# Patient Record
Sex: Female | Born: 1985 | Race: White | Hispanic: No | Marital: Married | Smoking: Current every day smoker
Health system: Southern US, Community
[De-identification: ages and names within clinical notes are randomized; demographics above are authoritative.]

## PROBLEM LIST (undated history)

## (undated) DIAGNOSIS — R569 Unspecified convulsions: Secondary | ICD-10-CM

## (undated) DIAGNOSIS — F32A Depression, unspecified: Secondary | ICD-10-CM

## (undated) DIAGNOSIS — F329 Major depressive disorder, single episode, unspecified: Secondary | ICD-10-CM

## (undated) DIAGNOSIS — F419 Anxiety disorder, unspecified: Secondary | ICD-10-CM

## (undated) HISTORY — PX: ABDOMINAL HYSTERECTOMY: SHX81

## (undated) HISTORY — PX: ABDOMINAL SURGERY: SHX537

---
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
## 2007-08-24 IMAGING — CT CT HEAD W/O CM
1 series · 16 of 28 positions shown, 20 images · IV contrast (agent unspecified)
Comparison: Previous study of 12/09/04.

CLINICAL DATA: Syncope.  Headache.  Patient had a fall.  
 HEAD CT WITHOUT CONTRAST:
TECHNIQUE: Contiguous axial images were obtained from the base of the skull through the vertex according to standard protocol without contrast.

[Series 625: — · axial · 0.44mm/px · z∈[-636,-511]mm · 16 of 28 slices shown, 20 images]
[im 2/28  brain]
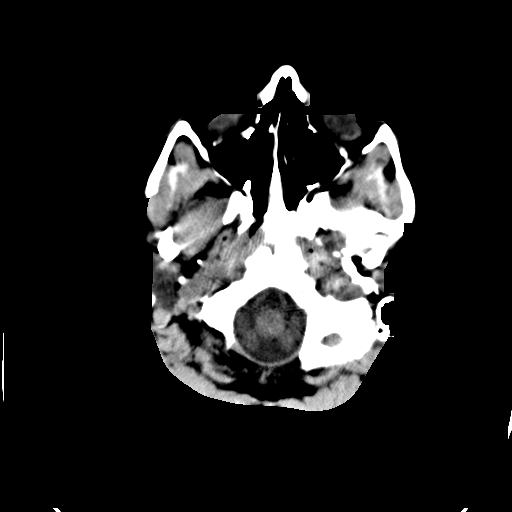
[im 2/28  bone]
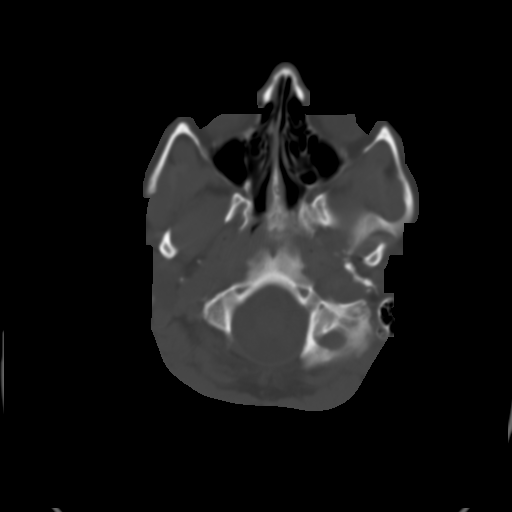
[im 4/28  brain]
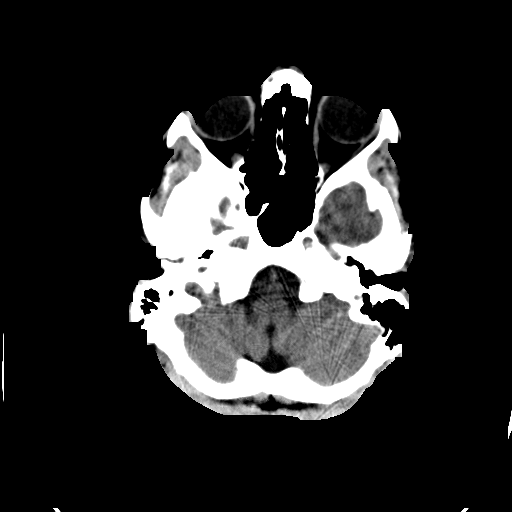
[im 6/28  brain]
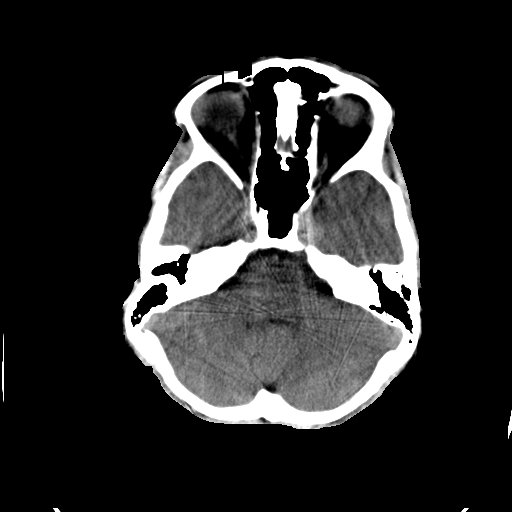
[im 7/28  brain]
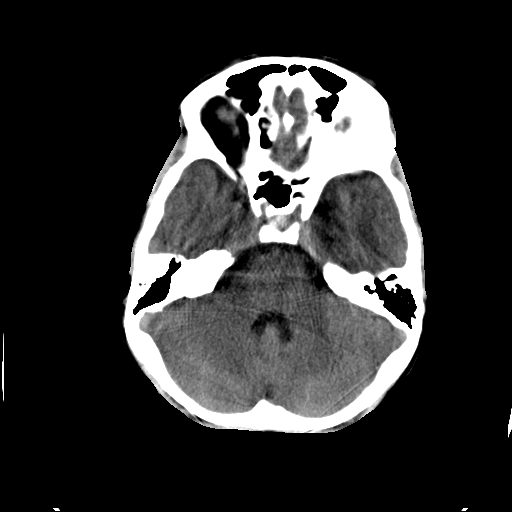
[im 9/28  brain]
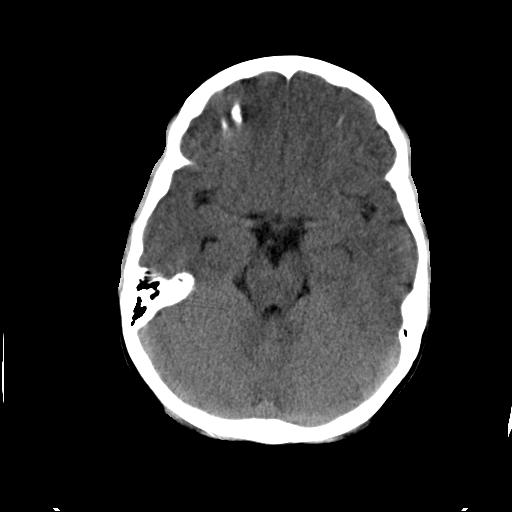
[im 9/28  bone]
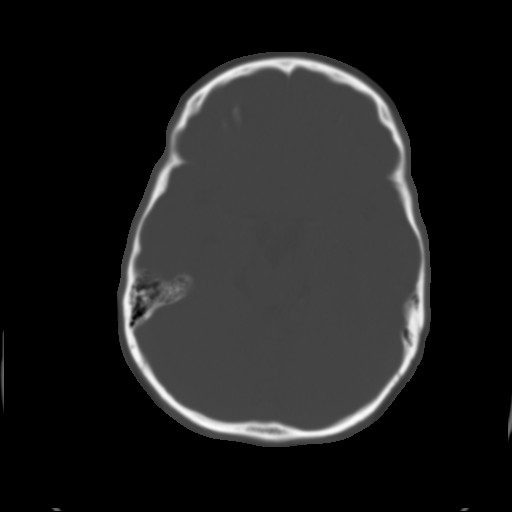
[im 10/28  brain]
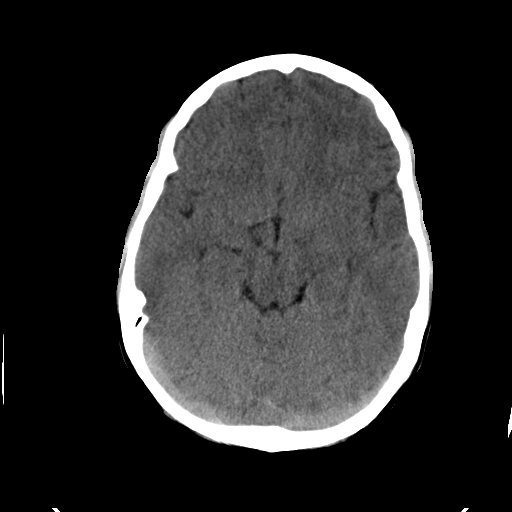
[im 12/28  brain]
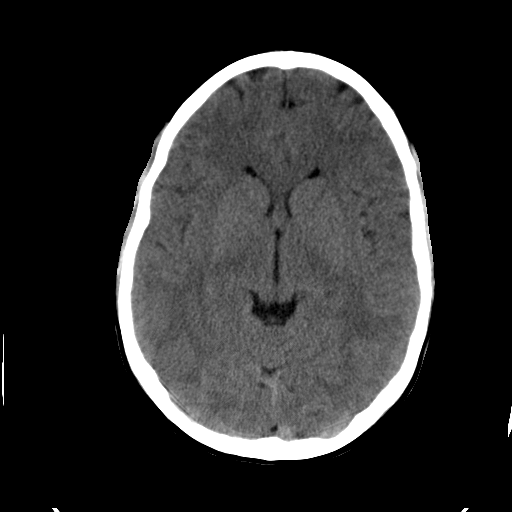
[im 14/28  brain]
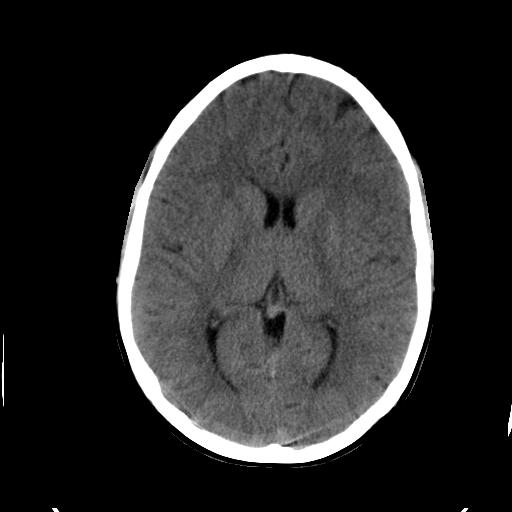
[im 15/28  brain]
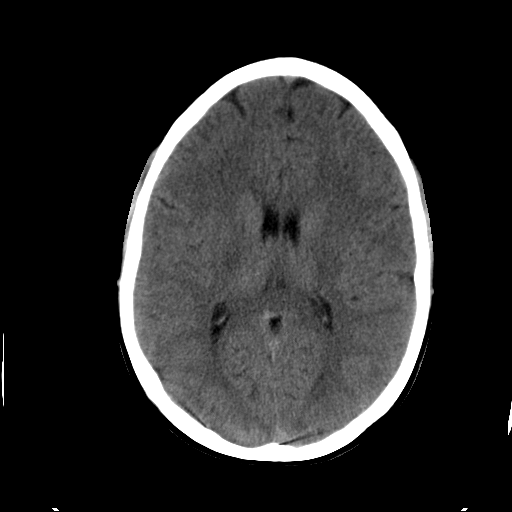
[im 15/28  bone]
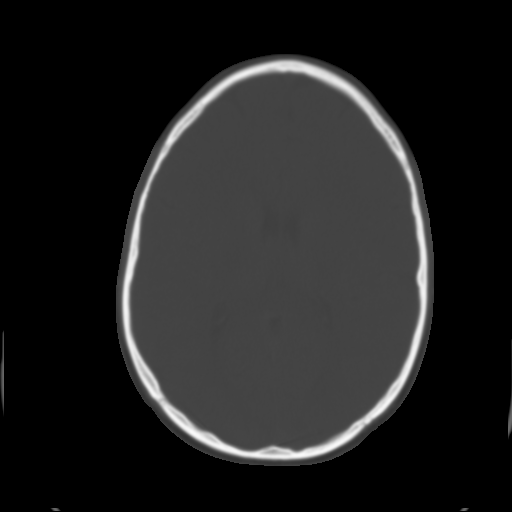
[im 17/28  brain]
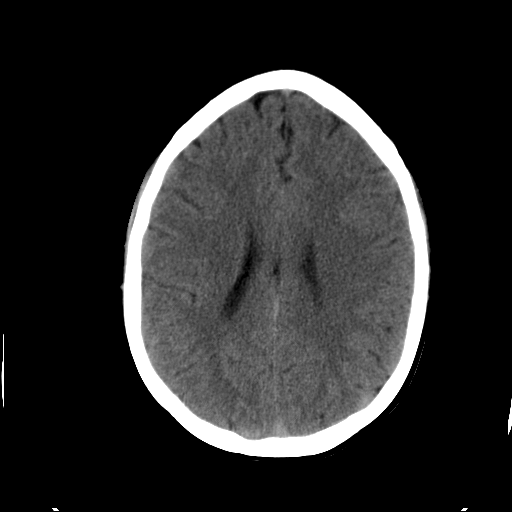
[im 19/28  brain]
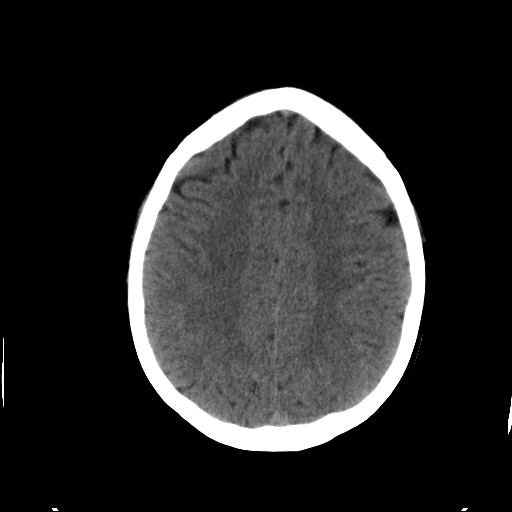
[im 20/28  brain]
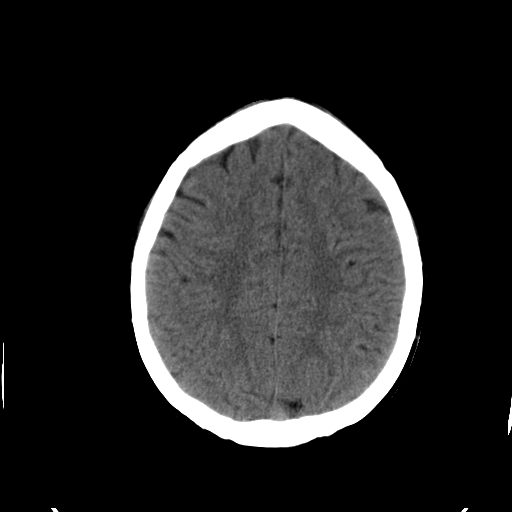
[im 22/28  brain]
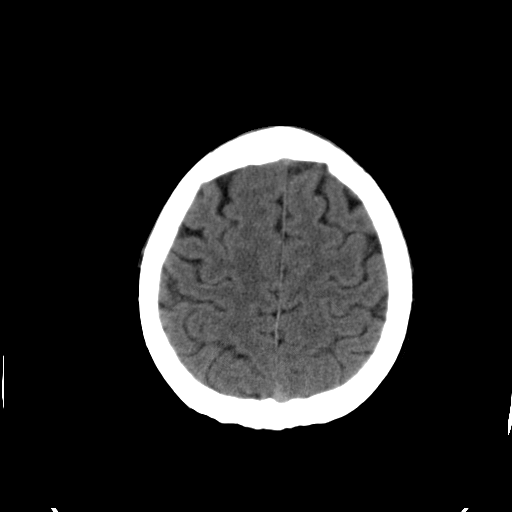
[im 22/28  bone]
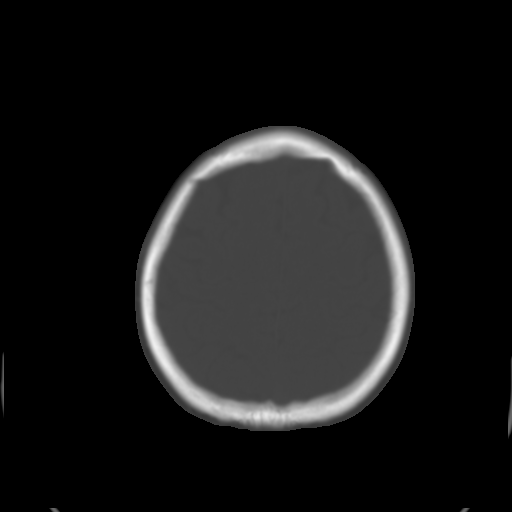
[im 23/28  brain]
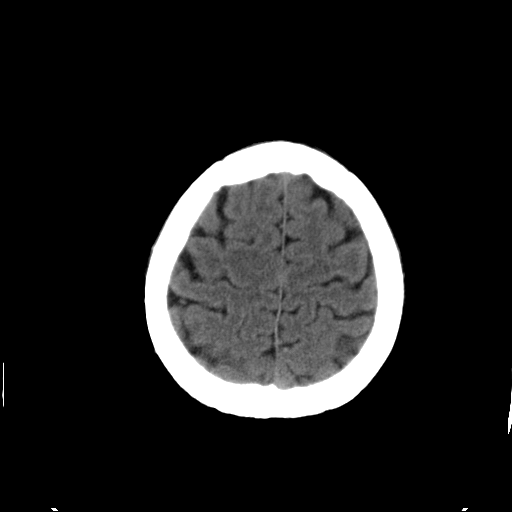
[im 25/28  brain]
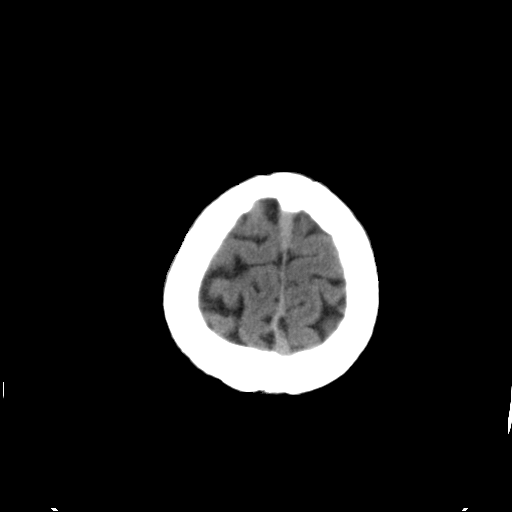
[im 27/28  brain]
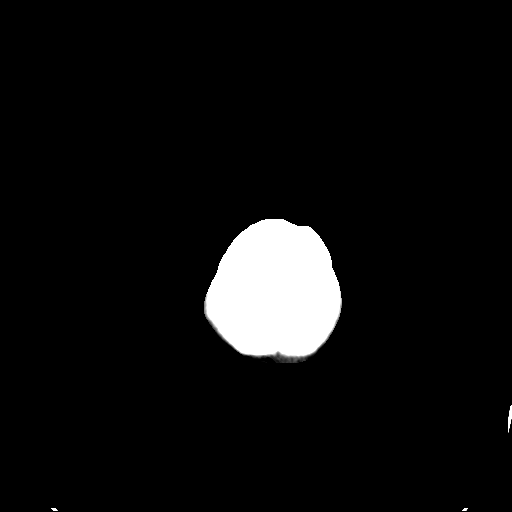

[16 of 28 positions shown; findings below may reference images not displayed]

FINDINGS: A series of scans of the head are made without contrast and show no evidence of skull fracture or foreign body.  There is no intracranial mass or hemorrhage and there is shift of the midline structures.  The sphenoid sinus again shows some mucosal membrane thickening along its posterolateral aspects but no air fluid level or bony destruction is seen.
IMPRESSION: No acute intracranial finding.  No skull fracture or foreign body.  No significant interval change in thickening right posterior and lateral aspects sphenoid sinus.

## 2007-10-30 ENCOUNTER — Emergency Department (HOSPITAL_COMMUNITY): Admission: EM | Admit: 2007-10-30 | Discharge: 2007-10-30 | Payer: Self-pay | Admitting: Emergency Medicine

## 2008-08-21 ENCOUNTER — Emergency Department (HOSPITAL_COMMUNITY): Admission: EM | Admit: 2008-08-21 | Discharge: 2008-08-21 | Payer: Self-pay | Admitting: Emergency Medicine

## 2008-10-24 ENCOUNTER — Emergency Department (HOSPITAL_COMMUNITY): Admission: EM | Admit: 2008-10-24 | Discharge: 2008-10-25 | Payer: Self-pay | Admitting: Emergency Medicine

## 2010-03-25 ENCOUNTER — Emergency Department (HOSPITAL_COMMUNITY)
Admission: EM | Admit: 2010-03-25 | Discharge: 2010-03-25 | Disposition: A | Discharge status: Home / Self Care | Payer: Self-pay | Attending: Emergency Medicine | Admitting: Emergency Medicine

## 2010-03-25 DIAGNOSIS — R197 Diarrhea, unspecified: Secondary | ICD-10-CM | POA: Insufficient documentation

## 2010-03-25 DIAGNOSIS — R112 Nausea with vomiting, unspecified: Secondary | ICD-10-CM | POA: Insufficient documentation

## 2010-03-25 DIAGNOSIS — B9789 Other viral agents as the cause of diseases classified elsewhere: Secondary | ICD-10-CM | POA: Insufficient documentation

## 2010-03-25 DIAGNOSIS — IMO0001 Reserved for inherently not codable concepts without codable children: Secondary | ICD-10-CM | POA: Insufficient documentation

## 2010-03-25 LAB — URINALYSIS, ROUTINE W REFLEX MICROSCOPIC
Glucose, UA: NEGATIVE mg/dL
Ketones, ur: >80 mg/dL — AB
Leukocytes, UA: NEGATIVE
Nitrite: NEGATIVE
Protein, ur: NEGATIVE mg/dL
Specific Gravity, Urine: >1.03 — ABNORMAL HIGH (ref 1.005–1.030)
Urobilinogen, UA: 1 mg/dL (ref 0.0–1.0)
pH: 5.5 (ref 5.0–8.0)

## 2010-03-25 LAB — URINE MICROSCOPIC-ADD ON

## 2010-03-25 LAB — POCT PREGNANCY, URINE: Preg Test, Ur: NEGATIVE

## 2010-04-06 LAB — URINE MICROSCOPIC-ADD ON

## 2010-04-06 LAB — WET PREP, GENITAL
Trich, Wet Prep: NONE SEEN
Yeast Wet Prep HPF POC: NONE SEEN

## 2010-04-06 LAB — PREGNANCY, URINE: Preg Test, Ur: NEGATIVE

## 2010-04-06 LAB — BASIC METABOLIC PANEL
BUN: 7 mg/dL (ref 6–23)
CO2: 24 mEq/L (ref 19–32)
Calcium: 8.8 mg/dL (ref 8.4–10.5)
Chloride: 106 mEq/L (ref 96–112)
Creatinine, Ser: 0.73 mg/dL (ref 0.4–1.2)
GFR calc Af Amer: >60 mL/min (ref >60)
GFR calc non Af Amer: >60 mL/min (ref >60)
Glucose, Bld: 116 mg/dL — ABNORMAL HIGH (ref 70–99)
Potassium: 3.5 mEq/L (ref 3.5–5.1)
Sodium: 137 mEq/L (ref 135–145)

## 2010-04-06 LAB — CBC
HCT: 41 % (ref 36.0–46.0)
Hemoglobin: 14.3 g/dL (ref 12.0–15.0)
MCHC: 34.8 g/dL (ref 30.0–36.0)
MCV: 99.4 fL (ref 78.0–100.0)
RBC: 4.13 MIL/uL (ref 3.87–5.11)
WBC: 23.3 10*3/uL — ABNORMAL HIGH (ref 4.0–10.5)

## 2010-04-06 LAB — URINALYSIS, ROUTINE W REFLEX MICROSCOPIC
Bilirubin Urine: NEGATIVE
Glucose, UA: NEGATIVE mg/dL
Hgb urine dipstick: NEGATIVE
Ketones, ur: NEGATIVE mg/dL
Protein, ur: NEGATIVE mg/dL
pH: 5.5 (ref 5.0–8.0)

## 2010-04-06 LAB — GC/CHLAMYDIA PROBE AMP, GENITAL
Chlamydia, DNA Probe: NEGATIVE
GC Probe Amp, Genital: NEGATIVE

## 2010-04-08 LAB — PREGNANCY, URINE: Preg Test, Ur: NEGATIVE

## 2010-05-19 NOTE — Procedures (Signed)
NAMECLEO, SANTUCCI                  ACCOUNT NO.:  [id number]   MEDICAL RECORD NO.:  [id number]          PATIENT TYPE:  OUT   LOCATION:  RAD                           FACILITY:  [facility]   PHYSICIAN:  [place] Bing, M.D. [facility] OF BIRTH:  [birth year]   DATE OF PROCEDURE:  05/24/2005  DATE OF DISCHARGE:                                  ECHOCARDIOGRAM   PROCEDURE:  Echocardiogram.   DATA:  This 25 year old woman with syncope.  M-mode error to 3.5, left  atrium 3.2, septum 0.9, posterior wall 0.9, LV diastole 4.5, LV systole 3.3,  RV diastole 3.2.   IMPRESSION:  1.  Technically adequate echocardiographic study.  2.  Normal left atrium and right atrium.  3.  Left ventricle is prominent.  No right ventricular hypertrophy.  Normal      function.  4.  Normal mitral valve with very mild regurgitation.  5.  Tri-leaflet and normal aortic valve.  6.  Normal tricuspid valve, physiologic regurgitation, normal left, mid and      right ventricular systolic pressure.  7.  Normal pulmonic valve and proximal pulmonary artery.  8.  Normal inferior vena cava.  9.  Normal descending aorta.      [place] Bing, M.D. [facility]  Electronically Signed     RR/MEDQ  D:  05/24/2005  T:  05/25/2005  Job:  [id number]

## 2010-08-14 ENCOUNTER — Other Ambulatory Visit: Payer: Self-pay | Admitting: Neurology

## 2010-08-14 DIAGNOSIS — R404 Transient alteration of awareness: Secondary | ICD-10-CM

## 2010-08-24 ENCOUNTER — Ambulatory Visit (HOSPITAL_COMMUNITY)
Admission: RE | Admit: 2010-08-24 | Discharge: 2010-08-24 | Disposition: A | Discharge status: Home / Self Care | Payer: Self-pay | Source: Ambulatory Visit | Attending: Neurology | Admitting: Neurology

## 2010-08-24 DIAGNOSIS — F29 Unspecified psychosis not due to a substance or known physiological condition: Secondary | ICD-10-CM | POA: Insufficient documentation

## 2010-08-24 DIAGNOSIS — R404 Transient alteration of awareness: Secondary | ICD-10-CM | POA: Insufficient documentation

## 2010-08-24 DIAGNOSIS — R259 Unspecified abnormal involuntary movements: Secondary | ICD-10-CM | POA: Insufficient documentation

## 2010-08-24 MED ORDER — GADOBENATE DIMEGLUMINE 529 MG/ML IV SOLN
10.0000 mL | Freq: Once | INTRAVENOUS | Status: AC
  Administered 2010-08-24: 10 mL via INTRAVENOUS

## 2010-10-02 LAB — BASIC METABOLIC PANEL
BUN: 7
CO2: 24
Calcium: 9.1
GFR calc non Af Amer: >60
Glucose, Bld: 112 — ABNORMAL HIGH
Sodium: 137

## 2010-10-02 LAB — CBC
HCT: 38.3
Hemoglobin: 13.4
MCHC: 34.9
Platelets: 281
RDW: 11.9

## 2010-10-02 LAB — DIFFERENTIAL
Basophils Absolute: 0
Basophils Relative: 0
Eosinophils Relative: 0
Monocytes Absolute: 0.4

## 2010-10-02 LAB — URINALYSIS, ROUTINE W REFLEX MICROSCOPIC
Bilirubin Urine: NEGATIVE
Ketones, ur: NEGATIVE
Leukocytes, UA: NEGATIVE
Nitrite: NEGATIVE
Protein, ur: NEGATIVE
pH: 6

## 2010-10-02 LAB — PREGNANCY, URINE: Preg Test, Ur: NEGATIVE

## 2010-10-02 LAB — D-DIMER, QUANTITATIVE: D-Dimer, Quant: 0.26

## 2014-12-26 ENCOUNTER — Encounter (HOSPITAL_COMMUNITY): Payer: Self-pay | Admitting: *Deleted

## 2014-12-26 ENCOUNTER — Emergency Department (HOSPITAL_COMMUNITY)
Admission: EM | Admit: 2014-12-26 | Discharge: 2014-12-27 | Disposition: A | Discharge status: Home / Self Care | Payer: BLUE CROSS/BLUE SHIELD | Attending: Emergency Medicine | Admitting: Emergency Medicine

## 2014-12-26 DIAGNOSIS — R0602 Shortness of breath: Secondary | ICD-10-CM | POA: Insufficient documentation

## 2014-12-26 DIAGNOSIS — Y69 Unspecified misadventure during surgical and medical care: Secondary | ICD-10-CM | POA: Insufficient documentation

## 2014-12-26 DIAGNOSIS — F329 Major depressive disorder, single episode, unspecified: Secondary | ICD-10-CM | POA: Diagnosis not present

## 2014-12-26 DIAGNOSIS — M549 Dorsalgia, unspecified: Secondary | ICD-10-CM | POA: Diagnosis not present

## 2014-12-26 DIAGNOSIS — R945 Abnormal results of liver function studies: Secondary | ICD-10-CM | POA: Diagnosis not present

## 2014-12-26 DIAGNOSIS — R110 Nausea: Secondary | ICD-10-CM | POA: Diagnosis not present

## 2014-12-26 DIAGNOSIS — F419 Anxiety disorder, unspecified: Secondary | ICD-10-CM | POA: Diagnosis not present

## 2014-12-26 DIAGNOSIS — M79601 Pain in right arm: Secondary | ICD-10-CM | POA: Insufficient documentation

## 2014-12-26 DIAGNOSIS — R7989 Other specified abnormal findings of blood chemistry: Secondary | ICD-10-CM

## 2014-12-26 DIAGNOSIS — G478 Other sleep disorders: Secondary | ICD-10-CM | POA: Insufficient documentation

## 2014-12-26 DIAGNOSIS — Z87891 Personal history of nicotine dependence: Secondary | ICD-10-CM | POA: Diagnosis not present

## 2014-12-26 DIAGNOSIS — R61 Generalized hyperhidrosis: Secondary | ICD-10-CM | POA: Insufficient documentation

## 2014-12-26 DIAGNOSIS — T43225A Adverse effect of selective serotonin reuptake inhibitors, initial encounter: Secondary | ICD-10-CM | POA: Insufficient documentation

## 2014-12-26 DIAGNOSIS — R0789 Other chest pain: Secondary | ICD-10-CM

## 2014-12-26 DIAGNOSIS — T887XXA Unspecified adverse effect of drug or medicament, initial encounter: Secondary | ICD-10-CM | POA: Diagnosis present

## 2014-12-26 HISTORY — DX: Major depressive disorder, single episode, unspecified: F32.9

## 2014-12-26 HISTORY — DX: Anxiety disorder, unspecified: F41.9

## 2014-12-26 HISTORY — DX: Depression, unspecified: F32.A

## 2014-12-26 NOTE — ED Notes (Signed)
Pt c/o having chest pain, back pain and bilateral arm pain; pt states they increased her medication x 5 days ago and the symptoms started tonight right after taking the medication

## 2014-12-26 NOTE — ED Notes (Signed)
Pt states chest pain & back pain that started around 2200. Pt states started after taking her medication tonight. Has been taking for a month they increased dosage a week ago.

## 2014-12-27 ENCOUNTER — Emergency Department (HOSPITAL_COMMUNITY): Payer: BLUE CROSS/BLUE SHIELD

## 2014-12-27 LAB — CBC WITH DIFFERENTIAL/PLATELET
BASOS ABS: 0 10*3/uL (ref 0.0–0.1)
BASOS PCT: 0 %
Eosinophils Absolute: 0 10*3/uL (ref 0.0–0.7)
Eosinophils Relative: 0 %
HEMATOCRIT: 37.5 % (ref 36.0–46.0)
HEMOGLOBIN: 13.1 g/dL (ref 12.0–15.0)
Lymphocytes Relative: 14 %
Lymphs Abs: 1.8 10*3/uL (ref 0.7–4.0)
MCH: 32.3 pg (ref 26.0–34.0)
MCHC: 34.9 g/dL (ref 30.0–36.0)
MCV: 92.6 fL (ref 78.0–100.0)
MONOS PCT: 5 %
Monocytes Absolute: 0.7 10*3/uL (ref 0.1–1.0)
NEUTROS ABS: 10.5 10*3/uL — AB (ref 1.7–7.7)
NEUTROS PCT: 81 %
Platelets: 283 10*3/uL (ref 150–400)
RBC: 4.05 MIL/uL (ref 3.87–5.11)
RDW: 12 % (ref 11.5–15.5)
WBC: 13 10*3/uL — ABNORMAL HIGH (ref 4.0–10.5)

## 2014-12-27 LAB — COMPREHENSIVE METABOLIC PANEL
ALK PHOS: 77 U/L (ref 38–126)
ALT: 145 U/L — ABNORMAL HIGH (ref 14–54)
ANION GAP: 4 — AB (ref 5–15)
AST: 193 U/L — ABNORMAL HIGH (ref 15–41)
Albumin: 4 g/dL (ref 3.5–5.0)
BUN: 15 mg/dL (ref 6–20)
CALCIUM: 8.8 mg/dL — AB (ref 8.9–10.3)
CO2: 25 mmol/L (ref 22–32)
Chloride: 109 mmol/L (ref 101–111)
Creatinine, Ser: 0.72 mg/dL (ref 0.44–1.00)
GFR calc non Af Amer: >60 mL/min (ref >60)
Glucose, Bld: 101 mg/dL — ABNORMAL HIGH (ref 65–99)
POTASSIUM: 4 mmol/L (ref 3.5–5.1)
SODIUM: 138 mmol/L (ref 135–145)
TOTAL PROTEIN: 6.9 g/dL (ref 6.5–8.1)
Total Bilirubin: 0.7 mg/dL (ref 0.3–1.2)

## 2014-12-27 LAB — TROPONIN I: Troponin I: <0.03 ng/mL (ref <0.031)

## 2014-12-27 LAB — D-DIMER, QUANTITATIVE (NOT AT ARMC)

## 2014-12-27 MED ORDER — KETOROLAC TROMETHAMINE 60 MG/2ML IM SOLN
60.0000 mg | Freq: Once | INTRAMUSCULAR | Status: AC
  Administered 2014-12-27: 60 mg via INTRAMUSCULAR
  Filled 2014-12-27: qty 2

## 2014-12-27 NOTE — Discharge Instructions (Signed)
Avoid fried, spicy or greasy foods. Return on Tuesday, December 27 to get an outpatient ultrasound of your gallbladder done at 3:15 pm. Arrive 15 minutes early. Do not eat or drink for at least 6 hours before your test (9 am). If you have gallstones, call Dr  York RamJenkin's office to get an appointment to discuss having your gallbladder removed. If you don't have gallstones, call Dr Jena Gaussourk, the gastroenterologist, to get an appointment to evaluate your elevated liver tests. You can take ibuprofen or acetaminophen for pain and breast feed your baby. Return to the ED if you get a fever, have uncontrolled vomiting or pain.

## 2014-12-27 NOTE — ED Provider Notes (Signed)
CSN: [id number]     Arrival date & time 12/26/14  2321 History  By signing my name below, I, Marica Otter, attest that this documentation has been prepared under the direction and in the presence of Devoria Albe, MD at 0017. Electronically Signed: Marica Otter, ED Scribe. 12/27/2014. 12:34 AM.  Chief Complaint  Patient presents with  . Medication Reaction   The history is provided by the patient. No language interpreter was used.    HPI Comments: Brittany French is a 29 y.o. female, with PMHx noted below including urinary frequency and nocturia and chronic abd pain from endometriosis, who presents to the Emergency Department complaining of atraumatic, sudden onset intermittent, sharp mid sternal and right sided chest pain with associated SOB, right arm pain, diaphoresis, and back pain onset yesterday night, Dec 25 at 10pm, 5 minutes after ingesting Zoloft. Pt reports the chest pain was severe for approximately one hour and she experienced associated nausea with the pain. The nausea has resolved and chest pain improved.  Pt reports she started Zoloft before thanksgiving and her provider increased the dosage 5 days ago. Pt denies vomiting, diarrhea, new abd pain, SI, HI. Patient states she was taken off Celexa because she had chest pain with Celexa also. Pt denies Hx of blood clots in herself or her family although may be an aunt did have blood clots. Patient states she has a history depression. She denies having suicidal or homicidal ideation. She does relate however she has had persistent nocturia since she stopped the Celexa which is interfering with her sleep. Marland Kitchen   PCP was Dr Renard [facility] in [place]   Past Medical History  Diagnosis Date  . Anxiety   . Depression    Past Surgical History  Procedure Laterality Date  . Abdominal surgery     History reviewed. No pertinent family history. Social History  Substance Use Topics  . Smoking status: Former Games developer  . Smokeless  tobacco: None  . 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 Negative for fever.  Respiratory: Positive for shortness of breath.   Cardiovascular: Positive for chest pain.  Gastrointestinal: Positive for nausea. Negative for vomiting and diarrhea.  Musculoskeletal: Positive for back pain and arthralgias (right arm pain).  Psychiatric/Behavioral: Positive for sleep disturbance (secondary to chronic frequency ). Negative for suicidal ideas and self-injury.  All other systems reviewed and are negative.  Allergies  Review of patient's allergies indicates no known allergies.  Home Medications   Zoloft and norethindrone   Triage Vitals: BP 110/63 mmHg  Pulse 75  Temp(Src) 98.4 F (36.9 C) (Oral)  Resp 20  Ht  (1.803 m)  Wt 161 lb 6.4 oz (73.211 kg)  BMI 22.52 kg/m2  SpO2 100%  LMP 12/02/2014  Vital signs normal   Physical Exam  Constitutional: She is oriented to person, place, and time. She appears well-developed and well-nourished.  Non-toxic appearance. She does not appear ill. No distress.  HENT:  Head: Normocephalic and atraumatic.  Right Ear: External ear normal.  Left Ear: External ear normal.  Nose: Nose normal. No mucosal edema or rhinorrhea.  Mouth/Throat: Oropharynx is clear and moist and mucous membranes are normal. No dental abscesses or uvula swelling.  Eyes: Conjunctivae and EOM are normal. Pupils are equal, round, and reactive to light.  Neck: Normal range  of motion and full passive range of motion without pain. Neck supple.  Cardiovascular: Normal rate, regular rhythm and normal heart sounds.  Exam reveals no gallop and no friction rub.   No murmur heard. Pulmonary/Chest: Effort normal and breath sounds normal. No respiratory distress. She has no wheezes. She has no rhonchi. She has no rales. She exhibits no tenderness and no  crepitus.  Abdominal: Soft. Normal appearance and bowel sounds are normal. She exhibits no distension. There is no tenderness. There is no rebound and no guarding.  Musculoskeletal: Normal range of motion. She exhibits no edema or tenderness.  Moves all extremities well.   Neurological: She is alert and oriented to person, place, and time. She has normal strength. No cranial nerve deficit.  Skin: Skin is warm, dry and intact. No rash noted. No erythema. No pallor.  Psychiatric: She has a normal mood and affect. Her speech is normal and behavior is normal. Her mood appears not anxious.  Nursing note and vitals reviewed.   ED Course  Procedures (including critical care time)  Medications  ketorolac (TORADOL) injection 60 mg (60 mg Intramuscular Given 12/27/14 0037)    DIAGNOSTIC STUDIES: Oxygen Saturation is 100% on ra, nl by my interpretation.    COORDINATION OF CARE: 12:32 AM: Discussed treatment plan which includes EKG, toradol, and labs with pt at bedside; patient verbalizes understanding and agrees with treatment plan. D-dimer was done to rule out PE with her having increased risk while being on birth control pills. Her symptoms do not sound like a drug reaction to her medication.  Recheck at 3 AM. Patient states her pain is better. We discussed her test results. She states she did have some pain in her right upper abdomen although she had not relayed that before. She does not drink alcohol. We discussed her test results are consistent with possible gallstones. Unfortunately ultrasound is not available at night. She is agreeable to coming back as an outpatient to have an ultrasound done. However she reports she cannot come back today. When asked which she had for dinner tonight she states "typical Christmas food" and when asked what that is she states similar to Thanksgiving. We discussed that she has gallstones she can call Dr. Lovell SheehanJenkins office, the general surgeon, to discuss getting her  gallbladder removed. If she does not have gallstone she should call Dr. Luvenia Starchourk's office, the gastroenterologist, to evaluate her elevated liver function tests.  Labs Review Results for orders placed or performed during the hospital encounter of 12/26/14  Comprehensive metabolic panel  Result Value Ref Range   Sodium 138 135 - 145 mmol/L   Potassium 4.0 3.5 - 5.1 mmol/L   Chloride 109 101 - 111 mmol/L   CO2 25 22 - 32 mmol/L   Glucose, Bld 101 (H) 65 - 99 mg/dL   BUN 15 6 - 20 mg/dL   Creatinine, Ser 1.610.72 0.44 - 1.00 mg/dL   Calcium 8.8 (L) 8.9 - 10.3 mg/dL   Total Protein 6.9 6.5 - 8.1 g/dL   Albumin 4.0 3.5 - 5.0 g/dL   AST 096193 (H) 15 - 41 U/L   ALT 145 (H) 14 - 54 U/L   Alkaline Phosphatase 77 38 - 126 U/L   Total Bilirubin 0.7 0.3 - 1.2 mg/dL   GFR calc non Af Amer >60 >60 mL/min   GFR calc Af Amer >60 >60 mL/min   Anion gap 4 (L) 5 - 15  CBC with Differential  Result Value Ref Range  WBC 13.0 (H) 4.0 - 10.5 K/uL   RBC 4.05 3.87 - 5.11 MIL/uL   Hemoglobin 13.1 12.0 - 15.0 g/dL   HCT 40.9 81.1 - 91.4 %   MCV 92.6 78.0 - 100.0 fL   MCH 32.3 26.0 - 34.0 pg   MCHC 34.9 30.0 - 36.0 g/dL   RDW 78.2 95.6 - 21.3 %   Platelets 283 150 - 400 K/uL   Neutrophils Relative % 81 %   Neutro Abs 10.5 (H) 1.7 - 7.7 K/uL   Lymphocytes Relative 14 %   Lymphs Abs 1.8 0.7 - 4.0 K/uL   Monocytes Relative 5 %   Monocytes Absolute 0.7 0.1 - 1.0 K/uL   Eosinophils Relative 0 %   Eosinophils Absolute 0.0 0.0 - 0.7 K/uL   Basophils Relative 0 %   Basophils Absolute 0.0 0.0 - 0.1 K/uL  Troponin I  Result Value Ref Range   Troponin I <0.03 <0.031 ng/mL  D-dimer, quantitative  Result Value Ref Range   D-Dimer, Quant <0.27 0.00 - 0.50 ug/mL-FEU   Laboratory interpretation all normal except elevation of LFTs, leukocytosis   I have personally reviewed and evaluated these lab results as part of my medical decision-making.   EKG Interpretation   Date/Time:  Sunday December 26 2014 23:59:13  EST Ventricular Rate:  66 PR Interval:  107 QRS Duration: 79 QT Interval:  388 QTC Calculation: 406 R Axis:   77 Text Interpretation:  Sinus rhythm Short PR interval Otherwise within  normal limits No old tracing to compare Confirmed by Mashayla Lavin  MD-I, Corbitt Cloke  ([id number]) on 12/27/2014 12:08:45 AM      MDM   Final diagnoses:  Atypical chest pain  Elevated liver function tests    Plan discharge  Devoria Albe, MD, FACEP   I personally performed the services described in this documentation, which was scribed in my presence. The recorded information has been reviewed and considered.   Devoria Albe, MD 12/27/14 [phone]

## 2014-12-28 ENCOUNTER — Ambulatory Visit (HOSPITAL_COMMUNITY)
Admission: RE | Admit: 2014-12-28 | Discharge: 2014-12-28 | Disposition: A | Discharge status: Home / Self Care | Payer: BLUE CROSS/BLUE SHIELD | Source: Ambulatory Visit | Attending: Emergency Medicine | Admitting: Emergency Medicine

## 2014-12-28 DIAGNOSIS — K8020 Calculus of gallbladder without cholecystitis without obstruction: Secondary | ICD-10-CM | POA: Diagnosis not present

## 2014-12-28 DIAGNOSIS — R1011 Right upper quadrant pain: Secondary | ICD-10-CM | POA: Diagnosis not present

## 2014-12-28 DIAGNOSIS — R7989 Other specified abnormal findings of blood chemistry: Secondary | ICD-10-CM | POA: Insufficient documentation

## 2014-12-28 NOTE — ED Provider Notes (Signed)
Patient returned for ultrasound. She does have cholelithiasis without evidence of cholecystitis. She is already arranging follow-up with Dr. Lovell SheehanJenkins.  Glynn OctaveStephen Jahkai Yandell, MD 12/28/14 [phone]

## 2015-01-11 NOTE — H&P (Signed)
  NTS SOAP Note  Vital Signs:  Vitals as of: 01/11/2015: Systolic 119: Diastolic 73: Heart Rate 95: Temp 99.71F (Temporal): Height 635ft 11in: Weight 155Lbs 0 Ounces: BMI 21.62   BMI : 21.62 kg/m2  Subjective: This 30 year old female presents for of gallstones.  Was seen in ER recently with right upper quadrant abdominal pain.  U/S of gallbladder shows cholelithiasis, normal common bile duct.  Still having nausea and right upper quadrant discomfort.  Diet controlled, avoiding fatty food.  No fever, chills, jaundice.  Is breast feeding.  Review of Symptoms:  dizziness Head:unremarkable Eyes:blurred vision bilateral Nose/Mouth/Throat:unremarkable Cardiovascular:  unremarkable Respiratory:unremarkable Gastrointestinabdominal pain, nausea, heartburn, dyspepsia Genitourinary:frequency Musculoskeletal:unremarkable Skin:unremarkable Hematolgic/Lymphatic:unremarkable   Allergic/Immunologic:unremarkable   Past Medical History:  Reviewed  Past Medical History  Surgical History: gyn surgery Medical Problems: depression Allergies: nkda Medications: zoloft, estrogen   Social History:Reviewed  Social History  Preferred Language: English Race:  White Ethnicity: Not Hispanic / Latino Age: 30 year Marital Status:  M Alcohol: no   Smoking Status: Light tobacco smoker reviewed on 01/11/2015 Started Date:  Packs per week:  Functional Status reviewed on 01/11/2015 ------------------------------------------------ Bathing: Normal Cooking: Normal Dressing: Normal Driving: Normal Eating: Normal Managing Meds: Normal Oral Care: Normal Shopping: Normal Toileting: Normal Transferring: Normal Walking: Normal Cognitive Status reviewed on 01/11/2015 ------------------------------------------------ Attention: Normal Decision Making: Normal Language: Normal Memory: Normal Motor: Normal Perception: Normal Problem Solving: Normal Visual and Spatial:  Normal   Family History:Reviewed  Family Health History Mother, Healthy;  Father, Healthy;     Objective Information: General:Well appearing, well nourished in no distress. Heart:RRR, no murmur or gallop.  Normal S1, S2.  No S3, S4.  Lungs:  CTA bilaterally, no wheezes, rhonchi, rales.  Breathing unlabored. Abdomen:Softmild discomfort to palpation in right upper quadrant, ND, normal bowel sounds, no HSM, no masses.  No peritoneal signs.  Assessment:biliary colic, cholelithiasis  Diagnoses: 574.20  K80.20 Gallstone (Calculus of gallbladder without cholecystitis without obstruction)  Procedures: [id number] - OFFICE OUTPATIENT NEW 30 MINUTES    Plan:  Scheduled for laparoscopic cholecystectomy on 01/14/15.  Pump before surgery, no breast feeding for 24 hours after surgery.   Patient Education:Alternative treatments to surgery were discussed with patient (and family).  Risks and benefits  of procedure including bleeding, infection, hepatobiliry injury, and the possibility of an open procedure were fully explained to the patient (and family) who gave informed consent. Patient/family questions were addressed.  Follow-up:Pending Surgery

## 2015-01-12 NOTE — Patient Instructions (Signed)
Brittany French  01/12/2015     @PREFPERIOPPHARMACY @   Your procedure is scheduled on  01/14/2015   Report to [facility] at  820  A.M.  Call this number if you have problems the morning of surgery:  [phone]   Remember:  Do not eat food or drink liquids after midnight.  Take these medicines the morning of surgery with A SIP OF WATER  zoloft.   Do not wear jewelry, make-up or nail polish.  Do not wear lotions, powders, or perfumes.  You may wear deodorant.  Do not shave 48 hours prior to surgery.  Men may shave face and neck.  Do not bring valuables to the hospital.  [facility] is not responsible for any belongings or valuables.  Contacts, dentures or bridgework may not be worn into surgery.  Leave your suitcase in the car.  After surgery it may be brought to your room.  For patients admitted to the hospital, discharge time will be determined by your treatment team.  Patients discharged the day of surgery will not be allowed to drive home.   Name and phone number of your driver:   family Special instructions:  none  Please read over the following fact sheets that you were given. Pain Booklet, Coughing and Deep Breathing, Surgical Site Infection Prevention, Anesthesia Post-op Instructions and Care and Recovery After Surgery      Laparoscopic Cholecystectomy Laparoscopic cholecystectomy is surgery to remove the gallbladder. The gallbladder is located in the upper right part of the abdomen, behind the liver. It is a storage sac for bile, which is produced in the liver. Bile aids in the digestion and absorption of fats. Cholecystectomy is often done for inflammation of the gallbladder (cholecystitis). This condition is usually caused by a buildup of gallstones (cholelithiasis) in the gallbladder. Gallstones can block the flow of bile, and that can result in inflammation and pain. In severe cases, emergency surgery may be required. If emergency surgery is not required,  you will have time to prepare for the procedure. Laparoscopic surgery is an alternative to open surgery. Laparoscopic surgery has a shorter recovery time. Your common bile duct may also need to be examined during the procedure. If stones are found in the common bile duct, they may be removed. LET [facility] CARE PROVIDER KNOW ABOUT:  Any allergies you have.  All medicines you are taking, including vitamins, herbs, eye drops, creams, and over-the-counter medicines.  Previous problems you or members of your family have had with the use of anesthetics.  Any blood disorders you have.  Previous surgeries you have had.  Any medical conditions you have. RISKS AND COMPLICATIONS Generally, this is a safe procedure. However, problems may occur, including:  Infection.  Bleeding.  Allergic reactions to medicines.  Damage to other structures or organs.  A stone remaining in the common bile duct.  A bile leak from the cyst duct that is clipped when your gallbladder is removed.  The need to convert to open surgery, which requires a larger incision in the abdomen. This may be necessary if your surgeon thinks that it is not safe to continue with a laparoscopic procedure. BEFORE THE PROCEDURE  Ask your health care provider about:  Changing or stopping your regular medicines. This is especially important if you are taking diabetes medicines or blood thinners.  Taking medicines such as aspirin and ibuprofen. These medicines can thin your blood. Do not take these medicines before  your procedure if your health care provider instructs you not to.  Follow instructions from your health care provider about eating or drinking restrictions.  Let your health care provider know if you develop a cold or an infection before surgery.  Plan to have someone take you home after the procedure.  Ask your health care provider how your surgical site will be marked or identified.  You may be given antibiotic  medicine to help prevent infection. PROCEDURE  To reduce your risk of infection:  Your health care team will wash or sanitize their hands.  Your skin will be washed with soap.  An IV tube may be inserted into one of your veins.  You will be given a medicine to make you fall asleep (general anesthetic).  A breathing tube will be placed in your mouth.  The surgeon will make several small cuts (incisions) in your abdomen.  A thin, lighted tube (laparoscope) that has a tiny camera on the end will be inserted through one of the small incisions. The camera on the laparoscope will send a picture to a TV screen (monitor) in the operating room. This will give the surgeon a good view inside your abdomen.  A gas will be pumped into your abdomen. This will expand your abdomen to give the surgeon more room to perform the surgery.  Other tools that are needed for the procedure will be inserted through the other incisions. The gallbladder will be removed through one of the incisions.  After your gallbladder has been removed, the incisions will be closed with stitches (sutures), staples, or skin glue.  Your incisions may be covered with a bandage (dressing). The procedure may vary among health care providers and hospitals. AFTER THE PROCEDURE  Your blood pressure, heart rate, breathing rate, and blood oxygen level will be monitored often until the medicines you were given have worn off.  You will be given medicines as needed to control your pain.   This information is not intended to replace advice given to you by your health care provider. Make sure you discuss any questions you have with your health care provider.   Document Released: 12/18/2004 Document Revised: 09/08/2014 Document Reviewed: 07/30/2012 Elsevier Interactive Patient Education 2016 [employer] Laparoscopic Cholecystectomy, Care After These instructions give you information about caring for yourself after your procedure. Your  doctor may also give you more specific instructions. Call your doctor if you have any problems or questions after your procedure. HOME CARE Incision Care  Follow instructions from your doctor about how to take care of your cuts from surgery (incisions). Make sure you:  Wash your hands with soap and water before you change your bandage (dressing). If you cannot use soap and water, use hand sanitizer.  Change your bandage as told by your doctor.  Leave stitches (sutures), skin glue, or skin tape (adhesive) strips in place. They may need to stay in place for 2 weeks or longer. If tape strips get loose and curl up, you may trim the loose edges. Do not remove tape strips completely unless your doctor says it is okay.  Do not take baths, swim, or use a hot tub until your doctor says it is okay. Ask your doctor if you can take showers. You may only be allowed to take sponge baths. General Instructions  Take over-the-counter and prescription medicines only as told by your doctor.  Do not drive or use heavy machinery while taking prescription pain medicine.  Return to your normal diet  as told by your doctor.  Do not lift anything that is heavier than 10 lb (4.5 kg).  Do not play contact sports for 1 week or until your doctor says it is okay. GET HELP IF:  You have redness, swelling, or pain at the site of your surgical cuts.  You have fluid, blood, or pus coming from your cuts.  You notice a bad smell coming from your cut area.  Your surgical cuts break open.  You have a fever. GET HELP RIGHT AWAY IF:   You have a rash.  You have trouble breathing.  You have chest pain.  You have pain in your shoulders (shoulder strap areas) that is getting worse.  You pass out (faint) or feel dizzy while you are standing.  You have very bad pain in your belly (abdomen).  You feel sick to your stomach (nauseous) or throw up (vomit) for more than 1 day.   This information is not intended to  replace advice given to you by your health care provider. Make sure you discuss any questions you have with your health care provider.   Document Released: 09/27/2007 Document Revised: 09/08/2014 Document Reviewed: 07/30/2012 Elsevier Interactive Patient Education 2016 [employer] PATIENT INSTRUCTIONS POST-ANESTHESIA  IMMEDIATELY FOLLOWING SURGERY:  Do not drive or operate machinery for the first twenty four hours after surgery.  Do not make any important decisions for twenty four hours after surgery or while taking narcotic pain medications or sedatives.  If you develop intractable nausea and vomiting or a severe headache please notify your doctor immediately.  FOLLOW-UP:  Please make an appointment with your surgeon as instructed. You do not need to follow up with anesthesia unless specifically instructed to do so.  WOUND CARE INSTRUCTIONS (if applicable):  Keep a dry clean dressing on the anesthesia/puncture wound site if there is drainage.  Once the wound has quit draining you may leave it open to air.  Generally you should leave the bandage intact for twenty four hours unless there is drainage.  If the epidural site drains for more than 36-48 hours please call the anesthesia department.  QUESTIONS?:  Please feel free to call your physician or the hospital operator if you have any questions, and they will be happy to assist you.

## 2015-01-13 ENCOUNTER — Encounter (HOSPITAL_COMMUNITY): Payer: Self-pay

## 2015-01-13 ENCOUNTER — Encounter (HOSPITAL_COMMUNITY)
Admission: RE | Admit: 2015-01-13 | Discharge: 2015-01-13 | Disposition: A | Discharge status: Home / Self Care | Payer: BLUE CROSS/BLUE SHIELD | Source: Ambulatory Visit | Attending: General Surgery | Admitting: General Surgery

## 2015-01-13 DIAGNOSIS — K8020 Calculus of gallbladder without cholecystitis without obstruction: Secondary | ICD-10-CM | POA: Diagnosis present

## 2015-01-13 DIAGNOSIS — Z79899 Other long term (current) drug therapy: Secondary | ICD-10-CM | POA: Diagnosis not present

## 2015-01-13 DIAGNOSIS — K8010 Calculus of gallbladder with chronic cholecystitis without obstruction: Secondary | ICD-10-CM | POA: Diagnosis not present

## 2015-01-13 DIAGNOSIS — F329 Major depressive disorder, single episode, unspecified: Secondary | ICD-10-CM | POA: Diagnosis not present

## 2015-01-13 DIAGNOSIS — Z01812 Encounter for preprocedural laboratory examination: Secondary | ICD-10-CM | POA: Diagnosis not present

## 2015-01-13 DIAGNOSIS — F419 Anxiety disorder, unspecified: Secondary | ICD-10-CM | POA: Diagnosis not present

## 2015-01-13 HISTORY — DX: Unspecified convulsions: R56.9

## 2015-01-13 LAB — CBC WITH DIFFERENTIAL/PLATELET
BASOS ABS: 0 10*3/uL (ref 0.0–0.1)
BASOS PCT: 0 %
EOS ABS: 0 10*3/uL (ref 0.0–0.7)
Eosinophils Relative: 1 %
HCT: 37.4 % (ref 36.0–46.0)
HEMOGLOBIN: 13.2 g/dL (ref 12.0–15.0)
LYMPHS ABS: 2.1 10*3/uL (ref 0.7–4.0)
Lymphocytes Relative: 33 %
MCH: 32.7 pg (ref 26.0–34.0)
MCHC: 35.3 g/dL (ref 30.0–36.0)
MCV: 92.6 fL (ref 78.0–100.0)
Monocytes Absolute: 0.3 10*3/uL (ref 0.1–1.0)
Monocytes Relative: 5 %
NEUTROS PCT: 61 %
Neutro Abs: 3.9 10*3/uL (ref 1.7–7.7)
Platelets: 306 10*3/uL (ref 150–400)
RBC: 4.04 MIL/uL (ref 3.87–5.11)
RDW: 11.6 % (ref 11.5–15.5)
WBC: 6.3 10*3/uL (ref 4.0–10.5)

## 2015-01-13 LAB — BASIC METABOLIC PANEL
Anion gap: 7 (ref 5–15)
BUN: 12 mg/dL (ref 6–20)
CHLORIDE: 108 mmol/L (ref 101–111)
CO2: 25 mmol/L (ref 22–32)
CREATININE: 0.76 mg/dL (ref 0.44–1.00)
Calcium: 9 mg/dL (ref 8.9–10.3)
Glucose, Bld: 89 mg/dL (ref 65–99)
Potassium: 3.7 mmol/L (ref 3.5–5.1)
SODIUM: 140 mmol/L (ref 135–145)

## 2015-01-13 LAB — HEPATIC FUNCTION PANEL
ALBUMIN: 4.3 g/dL (ref 3.5–5.0)
ALT: 26 U/L (ref 14–54)
AST: 17 U/L (ref 15–41)
Alkaline Phosphatase: 78 U/L (ref 38–126)
BILIRUBIN TOTAL: 1 mg/dL (ref 0.3–1.2)
Bilirubin, Direct: 0.1 mg/dL (ref 0.1–0.5)
Indirect Bilirubin: 0.9 mg/dL (ref 0.3–0.9)
TOTAL PROTEIN: 7.2 g/dL (ref 6.5–8.1)

## 2015-01-13 LAB — HCG, SERUM, QUALITATIVE: Preg, Serum: NEGATIVE

## 2015-01-14 ENCOUNTER — Ambulatory Visit (HOSPITAL_COMMUNITY)
Admission: RE | Admit: 2015-01-14 | Discharge: 2015-01-14 | Disposition: A | Discharge status: Home / Self Care | Payer: BLUE CROSS/BLUE SHIELD | Source: Ambulatory Visit | Attending: General Surgery | Admitting: General Surgery

## 2015-01-14 ENCOUNTER — Encounter (HOSPITAL_COMMUNITY): Payer: Self-pay | Admitting: *Deleted

## 2015-01-14 ENCOUNTER — Ambulatory Visit (HOSPITAL_COMMUNITY): Payer: BLUE CROSS/BLUE SHIELD | Admitting: Anesthesiology

## 2015-01-14 ENCOUNTER — Encounter (HOSPITAL_COMMUNITY)
Admission: RE | Disposition: A | Discharge status: Home / Self Care | Payer: Self-pay | Source: Ambulatory Visit | Attending: General Surgery

## 2015-01-14 DIAGNOSIS — F419 Anxiety disorder, unspecified: Secondary | ICD-10-CM | POA: Insufficient documentation

## 2015-01-14 DIAGNOSIS — Z01812 Encounter for preprocedural laboratory examination: Secondary | ICD-10-CM | POA: Insufficient documentation

## 2015-01-14 DIAGNOSIS — K8010 Calculus of gallbladder with chronic cholecystitis without obstruction: Secondary | ICD-10-CM | POA: Diagnosis not present

## 2015-01-14 DIAGNOSIS — Z79899 Other long term (current) drug therapy: Secondary | ICD-10-CM | POA: Insufficient documentation

## 2015-01-14 DIAGNOSIS — F329 Major depressive disorder, single episode, unspecified: Secondary | ICD-10-CM | POA: Insufficient documentation

## 2015-01-14 HISTORY — PX: CHOLECYSTECTOMY: SHX55

## 2015-01-14 SURGERY — LAPAROSCOPIC CHOLECYSTECTOMY
Anesthesia: General | Site: Abdomen

## 2015-01-14 MED ORDER — LACTATED RINGERS IV SOLN
INTRAVENOUS | Status: DC
  Administered 2015-01-14: 10:00:00 via INTRAVENOUS
  Administered 2015-01-14: 1000 mL via INTRAVENOUS

## 2015-01-14 MED ORDER — MIDAZOLAM HCL 2 MG/2ML IJ SOLN
INTRAMUSCULAR | Status: AC
  Filled 2015-01-14: qty 2

## 2015-01-14 MED ORDER — CEFAZOLIN SODIUM-DEXTROSE 2-3 GM-% IV SOLR
2.0000 g | INTRAVENOUS | Status: AC
Start: 2015-01-14 — End: 2015-01-14
  Administered 2015-01-14: 2 g via INTRAVENOUS

## 2015-01-14 MED ORDER — BUPIVACAINE HCL (PF) 0.5 % IJ SOLN
INTRAMUSCULAR | Status: DC | PRN
  Administered 2015-01-14: 10 mL

## 2015-01-14 MED ORDER — KETOROLAC TROMETHAMINE 30 MG/ML IJ SOLN
INTRAMUSCULAR | Status: AC
  Filled 2015-01-14: qty 1

## 2015-01-14 MED ORDER — PROPOFOL 10 MG/ML IV BOLUS
INTRAVENOUS | Status: DC | PRN
  Administered 2015-01-14: 150 mg via INTRAVENOUS

## 2015-01-14 MED ORDER — GLYCOPYRROLATE 0.2 MG/ML IJ SOLN
INTRAMUSCULAR | Status: DC | PRN
  Administered 2015-01-14: .6 mg via INTRAVENOUS

## 2015-01-14 MED ORDER — LIDOCAINE HCL (PF) 1 % IJ SOLN
INTRAMUSCULAR | Status: AC
  Filled 2015-01-14: qty 5

## 2015-01-14 MED ORDER — HEMOSTATIC AGENTS (NO CHARGE) OPTIME
TOPICAL | Status: DC | PRN
  Administered 2015-01-14: 1 via TOPICAL

## 2015-01-14 MED ORDER — POVIDONE-IODINE 10 % OINT PACKET
TOPICAL_OINTMENT | CUTANEOUS | Status: DC | PRN
  Administered 2015-01-14: 1 via TOPICAL

## 2015-01-14 MED ORDER — CEFAZOLIN SODIUM-DEXTROSE 2-3 GM-% IV SOLR
INTRAVENOUS | Status: AC
  Filled 2015-01-14: qty 50

## 2015-01-14 MED ORDER — ROCURONIUM BROMIDE 50 MG/5ML IV SOLN
INTRAVENOUS | Status: AC
  Filled 2015-01-14: qty 1

## 2015-01-14 MED ORDER — POVIDONE-IODINE 10 % EX OINT
TOPICAL_OINTMENT | CUTANEOUS | Status: AC
  Filled 2015-01-14: qty 1

## 2015-01-14 MED ORDER — OXYCODONE-ACETAMINOPHEN 7.5-325 MG PO TABS: 1.0000 | ORAL_TABLET | ORAL | Status: AC | PRN

## 2015-01-14 MED ORDER — ONDANSETRON HCL 4 MG/2ML IJ SOLN
4.0000 mg | Freq: Once | INTRAMUSCULAR | Status: AC | PRN
  Administered 2015-01-14: 4 mg via INTRAVENOUS
  Filled 2015-01-14: qty 2

## 2015-01-14 MED ORDER — SODIUM CHLORIDE 0.9 % IR SOLN
Status: DC | PRN
  Administered 2015-01-14: 1000 mL

## 2015-01-14 MED ORDER — ROCURONIUM BROMIDE 100 MG/10ML IV SOLN
INTRAVENOUS | Status: DC | PRN
  Administered 2015-01-14: 35 mg via INTRAVENOUS

## 2015-01-14 MED ORDER — GLYCOPYRROLATE 0.2 MG/ML IJ SOLN
INTRAMUSCULAR | Status: AC
  Filled 2015-01-14: qty 3

## 2015-01-14 MED ORDER — FENTANYL CITRATE (PF) 100 MCG/2ML IJ SOLN
25.0000 ug | INTRAMUSCULAR | Status: DC | PRN
Start: 2015-01-14 — End: 2015-01-14

## 2015-01-14 MED ORDER — ONDANSETRON HCL 4 MG PO TABS: 4.0000 mg | ORAL_TABLET | Freq: Three times a day (TID) | ORAL | Status: AC | PRN

## 2015-01-14 MED ORDER — NEOSTIGMINE METHYLSULFATE 10 MG/10ML IV SOLN
INTRAVENOUS | Status: DC | PRN
  Administered 2015-01-14: 3.5 mg via INTRAVENOUS

## 2015-01-14 MED ORDER — FENTANYL CITRATE (PF) 250 MCG/5ML IJ SOLN
INTRAMUSCULAR | Status: DC | PRN
  Administered 2015-01-14 (×5): 50 ug via INTRAVENOUS

## 2015-01-14 MED ORDER — FENTANYL CITRATE (PF) 250 MCG/5ML IJ SOLN
INTRAMUSCULAR | Status: AC
  Filled 2015-01-14: qty 5

## 2015-01-14 MED ORDER — NEOSTIGMINE METHYLSULFATE 10 MG/10ML IV SOLN
INTRAVENOUS | Status: AC
  Filled 2015-01-14: qty 1

## 2015-01-14 MED ORDER — CHLORHEXIDINE GLUCONATE 4 % EX LIQD: 1.0000 "application " | Freq: Once | CUTANEOUS | Status: DC

## 2015-01-14 MED ORDER — BUPIVACAINE HCL (PF) 0.5 % IJ SOLN
INTRAMUSCULAR | Status: AC
  Filled 2015-01-14: qty 30

## 2015-01-14 MED ORDER — LIDOCAINE HCL (CARDIAC) 20 MG/ML IV SOLN
INTRAVENOUS | Status: DC | PRN
  Administered 2015-01-14: 40 mg via INTRATRACHEAL

## 2015-01-14 MED ORDER — KETOROLAC TROMETHAMINE 30 MG/ML IJ SOLN
30.0000 mg | Freq: Once | INTRAMUSCULAR | Status: AC
  Administered 2015-01-14: 30 mg via INTRAVENOUS

## 2015-01-14 MED ORDER — MIDAZOLAM HCL 2 MG/2ML IJ SOLN
1.0000 mg | INTRAMUSCULAR | Status: DC | PRN
Start: 2015-01-14 — End: 2015-01-14
  Administered 2015-01-14 (×2): 2 mg via INTRAVENOUS

## 2015-01-14 SURGICAL SUPPLY — 43 items
APPLIER CLIP LAPSCP 10X32 DD (CLIP) ×2 IMPLANT
BAG HAMPER (MISCELLANEOUS) ×2 IMPLANT
CHLORAPREP W/TINT 26ML (MISCELLANEOUS) ×2 IMPLANT
CLOTH BEACON ORANGE TIMEOUT ST (SAFETY) ×2 IMPLANT
COVER LIGHT HANDLE STERIS (MISCELLANEOUS) ×4 IMPLANT
DECANTER SPIKE VIAL GLASS SM (MISCELLANEOUS) ×2 IMPLANT
DRAPE UTILITY W/TAPE 26X15 (DRAPES) ×4 IMPLANT
ELECT REM PT RETURN 9FT ADLT (ELECTROSURGICAL) ×2
ELECTRODE REM PT RTRN 9FT ADLT (ELECTROSURGICAL) ×1 IMPLANT
FILTER SMOKE EVAC LAPAROSHD (FILTER) ×2 IMPLANT
FORMALIN 10 PREFIL 120ML (MISCELLANEOUS) ×2 IMPLANT
GLOVE BIOGEL M 7.0 STRL (GLOVE) ×2 IMPLANT
GLOVE BIOGEL PI IND STRL 7.0 (GLOVE) ×1 IMPLANT
GLOVE BIOGEL PI INDICATOR 7.0 (GLOVE) ×1
GLOVE EXAM NITRILE MD LF STRL (GLOVE) ×2 IMPLANT
GLOVE SURG SS PI 7.5 STRL IVOR (GLOVE) ×2 IMPLANT
GOWN STRL REUS W/ TWL XL LVL3 (GOWN DISPOSABLE) ×1 IMPLANT
GOWN STRL REUS W/TWL LRG LVL3 (GOWN DISPOSABLE) ×4 IMPLANT
GOWN STRL REUS W/TWL XL LVL3 (GOWN DISPOSABLE) ×1
HEMOSTAT SNOW SURGICEL 2X4 (HEMOSTASIS) ×2 IMPLANT
INST SET LAPROSCOPIC AP (KITS) ×2 IMPLANT
IV NS IRRIG 3000ML ARTHROMATIC (IV SOLUTION) IMPLANT
KIT ROOM TURNOVER APOR (KITS) ×2 IMPLANT
MANIFOLD NEPTUNE II (INSTRUMENTS) ×2 IMPLANT
NEEDLE INSUFFLATION 14GA 120MM (NEEDLE) ×2 IMPLANT
NS IRRIG 1000ML POUR BTL (IV SOLUTION) ×2 IMPLANT
PACK LAP CHOLE LZT030E (CUSTOM PROCEDURE TRAY) IMPLANT
PACK PERI GYN (CUSTOM PROCEDURE TRAY) ×2 IMPLANT
PAD ARMBOARD 7.5X6 YLW CONV (MISCELLANEOUS) ×2 IMPLANT
POUCH SPECIMEN RETRIEVAL 10MM (ENDOMECHANICALS) ×2 IMPLANT
SET BASIN LINEN APH (SET/KITS/TRAYS/PACK) ×2 IMPLANT
SET TUBE IRRIG SUCTION NO TIP (IRRIGATION / IRRIGATOR) IMPLANT
SLEEVE ENDOPATH XCEL 5M (ENDOMECHANICALS) ×2 IMPLANT
SPONGE GAUZE 2X2 8PLY STRL LF (GAUZE/BANDAGES/DRESSINGS) ×8 IMPLANT
STAPLER VISISTAT (STAPLE) ×2 IMPLANT
SUT VICRYL 0 UR6 27IN ABS (SUTURE) ×2 IMPLANT
TAPE CLOTH SURG 4X10 WHT LF (GAUZE/BANDAGES/DRESSINGS) ×2 IMPLANT
TROCAR ENDO BLADELESS 11MM (ENDOMECHANICALS) ×2 IMPLANT
TROCAR XCEL NON-BLD 5MMX100MML (ENDOMECHANICALS) ×2 IMPLANT
TROCAR XCEL UNIV SLVE 11M 100M (ENDOMECHANICALS) ×2 IMPLANT
TUBING INSUFFLATION (TUBING) ×2 IMPLANT
WARMER LAPAROSCOPE (MISCELLANEOUS) ×2 IMPLANT
YANKAUER SUCT 12FT TUBE ARGYLE (SUCTIONS) ×2 IMPLANT

## 2015-01-14 NOTE — Anesthesia Postprocedure Evaluation (Signed)
Anesthesia Post Note  Patient: Annalea J Netherland  Procedure(s) Performed: Procedure(s) (LRB): LAPAROSCOPIC CHOLECYSTECTOMY (N/A)  Patient location during evaluation: Short Stay Anesthesia Type: General Level of consciousness: awake and alert Pain management: pain level controlled Vital Signs Assessment: post-procedure vital signs reviewed and stable Respiratory status: spontaneous breathing Cardiovascular status: blood pressure returned to baseline Postop Assessment: no signs of nausea or vomiting Anesthetic complications: no    Last Vitals:  Filed Vitals:   01/14/15 1144 01/14/15 1147  BP: 97/55 102/56  Pulse: 66 75  Temp:    Resp: 18 18    Last Pain:  Filed Vitals:   01/14/15 1148  PainSc: 2                  Zanovia Rotz

## 2015-01-14 NOTE — Anesthesia Preprocedure Evaluation (Signed)
Anesthesia Evaluation  Patient identified by MRN, date of birth, ID band Patient awake    Reviewed: Allergy & Precautions, NPO status , Patient's Chart, lab work & pertinent test results  Airway Mallampati: II  TM Distance: >3 FB Neck ROM: Full    Dental  (+) Teeth Intact, Dental Advisory Given   Pulmonary former smoker,    Pulmonary exam normal        Cardiovascular Normal cardiovascular exam     Neuro/Psych Seizures -, Well Controlled,  Anxiety Depression    GI/Hepatic   Endo/Other    Renal/GU      Musculoskeletal   Abdominal Normal abdominal exam  (+)   Peds  Hematology   Anesthesia Other Findings   Reproductive/Obstetrics (+) Breast feeding                              Anesthesia Physical Anesthesia Plan  ASA: II  Anesthesia Plan: General   Post-op Pain Management:    Induction: Intravenous  Airway Management Planned: Oral ETT  Additional Equipment:   Intra-op Plan:   Post-operative Plan: Extubation in OR  Informed Consent: I have reviewed the patients History and Physical, chart, labs and discussed the procedure including the risks, benefits and alternatives for the proposed anesthesia with the patient or authorized representative who has indicated his/her understanding and acceptance.   Dental advisory given  Plan Discussed with: CRNA  Anesthesia Plan Comments:         Anesthesia Quick Evaluation

## 2015-01-14 NOTE — Interval H&P Note (Signed)
History and Physical Interval Note:  01/14/2015 9:01 AM  Brittany French  has presented today for surgery, with the diagnosis of cholelithiasis  The various methods of treatment have been discussed with the patient and family. After consideration of risks, benefits and other options for treatment, the patient has consented to  Procedure(s): LAPAROSCOPIC CHOLECYSTECTOMY (N/A) as a surgical intervention .  The patient's history has been reviewed, patient examined, no change in status, stable for surgery.  I have reviewed the patient's chart and labs.  Questions were answered to the patient's satisfaction.     Franky MachoJENKINS,Quinnley Colasurdo A

## 2015-01-14 NOTE — Addendum Note (Signed)
Addendum  created 01/14/15 1233 by Moshe SalisburyKaren E Charley Lafrance, CRNA   Modules edited: Anesthesia Responsible Staff

## 2015-01-14 NOTE — Transfer of Care (Signed)
Immediate Anesthesia Transfer of Care Note  Patient: Brittany French  Procedure(s) Performed: Procedure(s): LAPAROSCOPIC CHOLECYSTECTOMY (N/A)  Patient Location: PACU  Anesthesia Type:General  Level of Consciousness: awake  Airway & Oxygen Therapy: Patient Spontanous Breathing and Patient connected to face mask oxygen  Post-op Assessment: Report given to RN  Post vital signs: Reviewed and stable  Last Vitals:  Filed Vitals:   01/14/15 0900 01/14/15 0905  BP: 106/68 102/63  Pulse:    Temp:    Resp: 15 12    Complications: No apparent anesthesia complications

## 2015-01-14 NOTE — Discharge Instructions (Signed)
Laparoscopic Cholecystectomy, Care After °Refer to this sheet in the next few weeks. These instructions provide you with information about caring for yourself after your procedure. Your health care provider may also give you more specific instructions. Your treatment has been planned according to current medical practices, but problems sometimes occur. Call your health care provider if you have any problems or questions after your procedure. °WHAT TO EXPECT AFTER THE PROCEDURE °After your procedure, it is common to have: °· Pain at your incision sites. You will be given pain medicines to control your pain. °· Mild nausea or vomiting. This should improve after the first 24 hours. °· Bloating and possible shoulder pain from the gas that was used during the procedure. This will improve after the first 24 hours. °HOME CARE INSTRUCTIONS °Incision Care °· Follow instructions from your health care provider about how to take care of your incisions. Make sure you: °¨ Wash your hands with soap and water before you change your bandage (dressing). If soap and water are not available, use hand sanitizer. °¨ Change your dressing as told by your health care provider. °¨ Leave stitches (sutures), skin glue, or adhesive strips in place. These skin closures may need to be in place for 2 weeks or longer. If adhesive strip edges start to loosen and curl up, you may trim the loose edges. Do not remove adhesive strips completely unless your health care provider tells you to do that. °· Do not take baths, swim, or use a hot tub until your health care provider approves. Ask your health care provider if you can take showers. You may only be allowed to take sponge baths for bathing. °General Instructions °· Take over-the-counter and prescription medicines only as told by your health care provider. °· Do not drive or operate heavy machinery while taking prescription pain medicine. °· Return to your normal diet as told by your health care  provider. °· Do not lift anything that is heavier than 10 lb (4.5 kg). °· Do not play contact sports for one week or until your health care provider approves. °SEEK MEDICAL CARE IF:  °· You have redness, swelling, or pain at the site of your incision. °· You have fluid, blood, or pus coming from your incision. °· You notice a bad smell coming from your incision area. °· Your surgical incisions break open. °· You have a fever. °SEEK IMMEDIATE MEDICAL CARE IF: °· You develop a rash. °· You have difficulty breathing. °· You have chest pain. °· You have increasing pain in your shoulders (shoulder strap areas). °· You faint or have dizzy episodes while you are standing. °· You have severe pain in your abdomen. °· You have nausea or vomiting that lasts for more than one day. °  °This information is not intended to replace advice given to you by your health care provider. Make sure you discuss any questions you have with your health care provider. °  °Document Released: 12/18/2004 Document Revised: 09/08/2014 Document Reviewed: 07/30/2012 °Elsevier Interactive Patient Education ©2016 Elsevier Inc. ° °

## 2015-01-14 NOTE — Op Note (Signed)
Patient:  Brittany French  DOB:  1985/09/30  MRN:  [id number]   Preop Diagnosis:  Biliary colic, cholelithiasis  Postop Diagnosis:  Same  Procedure:  Laparoscopic cholecystectomy  Surgeon:  Franky MachoMark Denny Lave, M.D.  Anes:  Gen. endotracheal  Indications:  Patient is a 30 year old white female who presents with biliary colic secondary to cholelithiasis. The risks and benefits of the procedure including bleeding, infection, hepatobiliary injury, and the possibility of an open procedure were fully explained to the patient, who gave informed consent.  Procedure note:  The patient was placed the supine position. After induction of general endotracheal anesthesia, the abdomen was prepped and draped using the usual sterile technique with DuraPrep. Surgical site confirmation was performed.  A supraumbilical incision was made down to the fascia. A Veress needle was introduced into the abdominal cavity and confirmation of placement was done using the saline drop test. The abdomen was then insufflated to 16 mmHg pressure. An 11 mm trocar was introduced into the abdomen under direct visualization without difficulty. The patient was placed in reverse Trendelenburg position and an additional 11 mm trocar was placed the epigastric region and 5 mm trochars were placed the right upper quadrant and right flank regions. Liver was inspected and noted to be within normal limits. The gallbladder was retracted in a dynamic fashion in order to provide a critical view of the triangle of Calot. The cystic duct was first identified. Its juncture to the infundibulum was fully identified. Endoclips were placed proximally and distally on the cystic duct, and the cystic duct was divided. This was likewise done to the cystic artery. The gallbladder was freed away from the gallbladder fossa using Bovie electrocautery. The gallbladder was delivered to the epigastric trocar site using an Endo Catch bag. The gallbladder fossa was inspected  and no abnormal bleeding or bile leakage was noted. Surgicel was placed the gallbladder fossa. All fluid and air were then evacuated from the abdominal cavity prior to removal of the trochars.  All wounds were irrigated with normal saline. All wounds were injected with 0.5% Sensorcaine. The supraumbilical fascia was reapproximated using an 0 Vicryl interrupted suture. All skin incisions were closed using staples. Betadine ointment and dressed a dressings were applied.  All tape and needle counts were correct at the end of the procedure. The patient was extubated in the operating room and transferred to PACU in stable condition.  Complications:  None  EBL:  Minimal  Specimen:  Gallbladder

## 2015-01-14 NOTE — Anesthesia Procedure Notes (Signed)
Procedure Name: Intubation Date/Time: 01/14/2015 9:22 AM Performed by: Glynn OctaveANIEL, Lewi Drost E Pre-anesthesia Checklist: Patient identified, Patient being monitored, Timeout performed, Emergency Drugs available and Suction available Patient Re-evaluated:Patient Re-evaluated prior to inductionOxygen Delivery Method: Circle System Utilized Preoxygenation: Pre-oxygenation with 100% oxygen Intubation Type: IV induction, Rapid sequence and Cricoid Pressure applied Ventilation: Mask ventilation without difficulty Laryngoscope Size: Mac and 3 Grade View: Grade II Tube type: Oral Tube size: 7.0 mm Number of attempts: 1 Airway Equipment and Method: stylet Placement Confirmation: ETT inserted through vocal cords under direct vision,  positive ETCO2 and breath sounds checked- equal and bilateral Secured at: 21 cm Tube secured with: Tape Dental Injury: Teeth and Oropharynx as per pre-operative assessment

## 2015-01-17 ENCOUNTER — Encounter (HOSPITAL_COMMUNITY): Payer: Self-pay | Admitting: General Surgery

## 2015-04-14 DIAGNOSIS — Z6822 Body mass index (BMI) 22.0-22.9, adult: Secondary | ICD-10-CM | POA: Diagnosis not present

## 2015-04-14 DIAGNOSIS — B001 Herpesviral vesicular dermatitis: Secondary | ICD-10-CM | POA: Diagnosis not present

## 2015-04-14 DIAGNOSIS — J020 Streptococcal pharyngitis: Secondary | ICD-10-CM | POA: Diagnosis not present

## 2015-04-19 DIAGNOSIS — D225 Melanocytic nevi of trunk: Secondary | ICD-10-CM | POA: Diagnosis not present

## 2015-04-19 DIAGNOSIS — B351 Tinea unguium: Secondary | ICD-10-CM | POA: Diagnosis not present

## 2015-04-19 DIAGNOSIS — L700 Acne vulgaris: Secondary | ICD-10-CM | POA: Diagnosis not present

## 2015-05-04 DIAGNOSIS — L700 Acne vulgaris: Secondary | ICD-10-CM | POA: Diagnosis not present

## 2015-05-04 DIAGNOSIS — F411 Generalized anxiety disorder: Secondary | ICD-10-CM | POA: Diagnosis not present

## 2015-05-04 DIAGNOSIS — F331 Major depressive disorder, recurrent, moderate: Secondary | ICD-10-CM | POA: Diagnosis not present

## 2015-05-04 DIAGNOSIS — M79672 Pain in left foot: Secondary | ICD-10-CM | POA: Diagnosis not present

## 2015-05-09 DIAGNOSIS — J029 Acute pharyngitis, unspecified: Secondary | ICD-10-CM | POA: Diagnosis not present

## 2015-05-09 DIAGNOSIS — Z6823 Body mass index (BMI) 23.0-23.9, adult: Secondary | ICD-10-CM | POA: Diagnosis not present

## 2015-05-24 DIAGNOSIS — F3181 Bipolar II disorder: Secondary | ICD-10-CM | POA: Diagnosis not present

## 2015-05-24 DIAGNOSIS — F4310 Post-traumatic stress disorder, unspecified: Secondary | ICD-10-CM | POA: Diagnosis not present

## 2015-06-06 DIAGNOSIS — Z6826 Body mass index (BMI) 26.0-26.9, adult: Secondary | ICD-10-CM | POA: Diagnosis not present

## 2015-06-06 DIAGNOSIS — R102 Pelvic and perineal pain: Secondary | ICD-10-CM | POA: Diagnosis not present

## 2015-07-12 DIAGNOSIS — D62 Acute posthemorrhagic anemia: Secondary | ICD-10-CM | POA: Diagnosis not present

## 2015-07-12 DIAGNOSIS — N808 Other endometriosis: Secondary | ICD-10-CM | POA: Diagnosis not present

## 2015-07-12 DIAGNOSIS — N809 Endometriosis, unspecified: Secondary | ICD-10-CM | POA: Diagnosis not present

## 2015-07-12 DIAGNOSIS — R102 Pelvic and perineal pain: Secondary | ICD-10-CM | POA: Diagnosis not present

## 2015-07-12 DIAGNOSIS — N946 Dysmenorrhea, unspecified: Secondary | ICD-10-CM | POA: Diagnosis not present

## 2015-07-12 DIAGNOSIS — Z87891 Personal history of nicotine dependence: Secondary | ICD-10-CM | POA: Diagnosis not present

## 2015-07-16 DIAGNOSIS — R509 Fever, unspecified: Secondary | ICD-10-CM | POA: Diagnosis not present

## 2015-07-16 DIAGNOSIS — R110 Nausea: Secondary | ICD-10-CM | POA: Diagnosis not present

## 2015-07-16 DIAGNOSIS — Z79899 Other long term (current) drug therapy: Secondary | ICD-10-CM | POA: Diagnosis not present

## 2015-07-16 DIAGNOSIS — Z90710 Acquired absence of both cervix and uterus: Secondary | ICD-10-CM | POA: Diagnosis not present

## 2015-07-19 DIAGNOSIS — F4310 Post-traumatic stress disorder, unspecified: Secondary | ICD-10-CM | POA: Diagnosis not present

## 2015-07-19 DIAGNOSIS — R5082 Postprocedural fever: Secondary | ICD-10-CM | POA: Diagnosis not present

## 2015-07-19 DIAGNOSIS — F3181 Bipolar II disorder: Secondary | ICD-10-CM | POA: Diagnosis not present

## 2015-08-31 DIAGNOSIS — F3181 Bipolar II disorder: Secondary | ICD-10-CM | POA: Diagnosis not present

## 2015-08-31 DIAGNOSIS — F4310 Post-traumatic stress disorder, unspecified: Secondary | ICD-10-CM | POA: Diagnosis not present

## 2015-09-13 DIAGNOSIS — Z6823 Body mass index (BMI) 23.0-23.9, adult: Secondary | ICD-10-CM | POA: Diagnosis not present

## 2015-09-13 DIAGNOSIS — J309 Allergic rhinitis, unspecified: Secondary | ICD-10-CM | POA: Diagnosis not present

## 2015-09-15 DIAGNOSIS — R5383 Other fatigue: Secondary | ICD-10-CM | POA: Diagnosis not present

## 2015-10-19 DIAGNOSIS — F4310 Post-traumatic stress disorder, unspecified: Secondary | ICD-10-CM | POA: Diagnosis not present

## 2015-10-19 DIAGNOSIS — F3181 Bipolar II disorder: Secondary | ICD-10-CM | POA: Diagnosis not present

## 2015-11-05 DIAGNOSIS — J309 Allergic rhinitis, unspecified: Secondary | ICD-10-CM | POA: Diagnosis not present

## 2015-11-05 DIAGNOSIS — Z6822 Body mass index (BMI) 22.0-22.9, adult: Secondary | ICD-10-CM | POA: Diagnosis not present

## 2015-11-08 DIAGNOSIS — Z6823 Body mass index (BMI) 23.0-23.9, adult: Secondary | ICD-10-CM | POA: Diagnosis not present

## 2015-11-08 DIAGNOSIS — J039 Acute tonsillitis, unspecified: Secondary | ICD-10-CM | POA: Diagnosis not present

## 2015-12-07 DIAGNOSIS — F3181 Bipolar II disorder: Secondary | ICD-10-CM | POA: Diagnosis not present

## 2015-12-07 DIAGNOSIS — F4310 Post-traumatic stress disorder, unspecified: Secondary | ICD-10-CM | POA: Diagnosis not present

## 2016-02-01 DIAGNOSIS — F4310 Post-traumatic stress disorder, unspecified: Secondary | ICD-10-CM | POA: Diagnosis not present

## 2016-02-01 DIAGNOSIS — F3181 Bipolar II disorder: Secondary | ICD-10-CM | POA: Diagnosis not present

## 2016-03-21 DIAGNOSIS — F4310 Post-traumatic stress disorder, unspecified: Secondary | ICD-10-CM | POA: Diagnosis not present

## 2016-03-21 DIAGNOSIS — F3181 Bipolar II disorder: Secondary | ICD-10-CM | POA: Diagnosis not present

## 2016-06-27 DIAGNOSIS — F3181 Bipolar II disorder: Secondary | ICD-10-CM | POA: Diagnosis not present

## 2016-06-27 DIAGNOSIS — F4310 Post-traumatic stress disorder, unspecified: Secondary | ICD-10-CM | POA: Diagnosis not present

## 2016-06-29 DIAGNOSIS — R109 Unspecified abdominal pain: Secondary | ICD-10-CM | POA: Diagnosis not present

## 2016-06-29 DIAGNOSIS — Z6822 Body mass index (BMI) 22.0-22.9, adult: Secondary | ICD-10-CM | POA: Diagnosis not present

## 2016-06-29 DIAGNOSIS — R197 Diarrhea, unspecified: Secondary | ICD-10-CM | POA: Diagnosis not present

## 2016-07-13 DIAGNOSIS — R109 Unspecified abdominal pain: Secondary | ICD-10-CM | POA: Diagnosis not present

## 2016-07-13 DIAGNOSIS — R197 Diarrhea, unspecified: Secondary | ICD-10-CM | POA: Diagnosis not present

## 2016-07-13 DIAGNOSIS — Z9049 Acquired absence of other specified parts of digestive tract: Secondary | ICD-10-CM | POA: Diagnosis not present

## 2016-08-30 ENCOUNTER — Emergency Department (HOSPITAL_COMMUNITY)
Admission: EM | Admit: 2016-08-30 | Discharge: 2016-08-30 | Disposition: A | Discharge status: Home / Self Care | Payer: BLUE CROSS/BLUE SHIELD | Attending: Emergency Medicine | Admitting: Emergency Medicine

## 2016-08-30 ENCOUNTER — Encounter (HOSPITAL_COMMUNITY): Payer: Self-pay | Admitting: Emergency Medicine

## 2016-08-30 DIAGNOSIS — R233 Spontaneous ecchymoses: Secondary | ICD-10-CM | POA: Diagnosis not present

## 2016-08-30 DIAGNOSIS — R58 Hemorrhage, not elsewhere classified: Secondary | ICD-10-CM

## 2016-08-30 DIAGNOSIS — F17210 Nicotine dependence, cigarettes, uncomplicated: Secondary | ICD-10-CM | POA: Diagnosis not present

## 2016-08-30 DIAGNOSIS — M79662 Pain in left lower leg: Secondary | ICD-10-CM | POA: Diagnosis not present

## 2016-08-30 DIAGNOSIS — M79605 Pain in left leg: Secondary | ICD-10-CM | POA: Diagnosis not present

## 2016-08-30 DIAGNOSIS — Z79899 Other long term (current) drug therapy: Secondary | ICD-10-CM | POA: Insufficient documentation

## 2016-08-30 LAB — I-STAT CHEM 8, ED
BUN: 9 mg/dL (ref 6–20)
CALCIUM ION: 1.17 mmol/L (ref 1.15–1.40)
CREATININE: 0.8 mg/dL (ref 0.44–1.00)
Chloride: 104 mmol/L (ref 101–111)
Glucose, Bld: 105 mg/dL — ABNORMAL HIGH (ref 65–99)
HCT: 41 % (ref 36.0–46.0)
HEMOGLOBIN: 13.9 g/dL (ref 12.0–15.0)
POTASSIUM: 3.5 mmol/L (ref 3.5–5.1)
Sodium: 141 mmol/L (ref 135–145)
TCO2: 25 mmol/L (ref 22–32)

## 2016-08-30 LAB — I-STAT BETA HCG BLOOD, ED (MC, WL, AP ONLY): I-stat hCG, quantitative: <5 m[IU]/mL (ref <5)

## 2016-08-30 MED ORDER — ENOXAPARIN SODIUM 80 MG/0.8ML ~~LOC~~ SOLN
1.0000 mg/kg | Freq: Once | SUBCUTANEOUS | Status: AC
  Administered 2016-08-30: 75 mg via SUBCUTANEOUS
  Filled 2016-08-30: qty 0.8

## 2016-08-30 NOTE — ED Provider Notes (Signed)
[facility] Provider Note   CSN: [id number] Arrival date & time: 08/30/16  1713     History   Chief Complaint Chief Complaint  Patient presents with  . Leg Pain    HPI Brittany French is a 31 y.o. female.  HPI Pt was seen at 1725. Per pt, c/o gradual onset and persistence of constant "bruise" to her left calf for the past 1.5 weeks. Has been associated with "calf muscle cramps." Pt states she called her PMD and was told to come to the ED "for an ultrasound to make sure I didn't have a blood clot." Denies injury, no focal motor weakness, no tingling/numbness in extremities, no back pain, no joints pain.    Past Medical History:  Diagnosis Date  . Anxiety   . Depression   . Seizures (HCC)    Last episode 2012    There are no active problems to display for this patient.   Past Surgical History:  Procedure Laterality Date  . ABDOMINAL HYSTERECTOMY    . ABDOMINAL SURGERY    . CHOLECYSTECTOMY N/A 01/14/2015   Procedure: LAPAROSCOPIC CHOLECYSTECTOMY;  Surgeon: Franky MachoMark Jenkins, MD;  Location: [facility];  Service: General;  Laterality: N/A;    OB History    No data available       Home Medications    Prior to Admission medications   Medication Sig Start Date End Date Taking? Authorizing Provider  acetaminophen (TYLENOL) 325 MG tablet Take 650 mg by mouth every 6 (six) hours as needed.    [provider]  norethindrone-ethinyl estradiol (NECON,BREVICON,MODICON) 0.5-35 MG-MCG tablet Take 1 tablet by mouth daily.    [provider]  ondansetron (ZOFRAN) 4 MG tablet Take 1 tablet (4 mg total) by mouth every 8 (eight) hours as needed for nausea or vomiting. 01/14/15   Franky MachoJenkins, Mark, MD  oxyCODONE-acetaminophen (PERCOCET) 7.5-325 MG tablet Take 1-2 tablets by mouth every 4 (four) hours as needed. 01/14/15   Franky MachoJenkins, Mark, MD  sertraline (ZOLOFT) 100 MG tablet Take 100 mg by mouth at bedtime.    [provider]    Family History History reviewed. No  pertinent family history.  Social History Social History  Substance Use Topics  . Smoking status: Current Every Day Smoker    Packs/day: 0.50    Years: 9.00    Last attempt to quit: 01/13/2012  . Smokeless tobacco: Not on file  . Alcohol use No     Allergies   Patient has no known allergies.   Review of Systems Review of Systems ROS: Statement: All systems negative except as marked or noted in the HPI; Constitutional: Negative for fever and chills. ; ; Eyes: Negative for eye pain, redness and discharge. ; ; ENMT: Negative for ear pain, hoarseness, nasal congestion, sinus pressure and sore throat. ; ; Cardiovascular: Negative for chest pain, palpitations, diaphoresis, dyspnea and peripheral edema. ; ; Respiratory: Negative for cough, wheezing and stridor. ; ; Gastrointestinal: Negative for nausea, vomiting, diarrhea, abdominal pain, blood in stool, hematemesis, jaundice and rectal bleeding. . ; ; Genitourinary: Negative for dysuria, flank pain and hematuria. ; ; Musculoskeletal: +left calf bruise and cramping. Negative for back pain and neck pain. Negative for swelling and trauma.; ; Skin: Negative for pruritus, rash, abrasions, blisters, and skin lesion.; ; Neuro: Negative for headache, lightheadedness and neck stiffness. Negative for weakness, altered level of consciousness, altered mental status, extremity weakness, paresthesias, involuntary movement, seizure and syncope.      Physical Exam Updated Vital Signs  BP 108/69 (BP Location: Right Arm)   Pulse (!) 105   Temp 98.1 F (36.7 C) (Oral)   Resp 18   Ht 5\' 11"  (1.803 m)   Wt 72.6 kg (160 lb)   LMP 12/02/2014   SpO2 100%   BMI 22.32 kg/m   Physical Exam 1730: Physical examination:  Nursing notes reviewed; Vital signs and O2 SAT reviewed;  Constitutional: Well developed, Well nourished, Well hydrated, In no acute distress; Head:  Normocephalic, atraumatic; Eyes: EOMI, PERRL, No scleral icterus; ENMT: Mouth and pharynx normal,  Mucous membranes moist; Neck: Supple, Full range of motion, No lymphadenopathy; Cardiovascular: Regular rate and rhythm, No gallop; Respiratory: Breath sounds clear & equal bilaterally, No wheezes.  Speaking full sentences with ease, Normal respiratory effort/excursion; Chest: Nontender, Movement normal; Abdomen: Soft, Nontender, Nondistended, Normal bowel sounds; Genitourinary: No CVA tenderness; Extremities: Pulses normal, NT left knee/ankle/foot. No deformity. No open wounds. No calf tenderness, erythema, edema or asymmetry. +small NT bruise left calf. ; Neuro: AA&Ox3, Major CN grossly intact.  Speech clear. No gross focal motor or sensory deficits in extremities.; Skin: Color normal, Warm, Dry.   ED Treatments / Results  Labs (all labs ordered are listed, but only abnormal results are displayed)   EKG  EKG Interpretation None       Radiology   Procedures Procedures (including critical care time)  Medications Ordered in ED Medications - No data to display   Initial Impression / Assessment and Plan / ED Course  I have reviewed the triage vital signs and the nursing notes.  Pertinent labs & imaging results that were available during my care of the patient were reviewed by me and considered in my medical decision making (see chart for details).  MDM Reviewed: previous chart, nursing note and vitals Reviewed previous: labs Interpretation: labs    Results for orders placed or performed during the hospital encounter of 08/30/16  I-stat Chem 8, ED  Result Value Ref Range   Sodium 141 135 - 145 mmol/L   Potassium 3.5 3.5 - 5.1 mmol/L   Chloride 104 101 - 111 mmol/L   BUN 9 6 - 20 mg/dL   Creatinine, Ser 1.61 0.44 - 1.00 mg/dL   Glucose, Bld 096 (H) 65 - 99 mg/dL   Calcium, Ion 0.45 4.09 - 1.40 mmol/L   TCO2 25 22 - 32 mmol/L   Hemoglobin 13.9 12.0 - 15.0 g/dL   HCT 81.1 91.4 - 78.2 %  I-Stat beta hCG blood, ED  Result Value Ref Range   I-stat hCG, quantitative <5.0 <5  mIU/mL   Comment 3            1830:  Labs reassuring. Tx SQ lovenox and obtain outpt Vasc US tomorrow morning. Pt agreeable with plan. Dx and testing d/w pt and family.  Questions answered.  Verb understanding, agreeable to d/c home with outpt f/u.   Final Clinical Impressions(s) / ED Diagnoses   Final diagnoses:  None    New Prescriptions New Prescriptions   No medications on file     Samuel Jester, DO 08/31/16 2110

## 2016-08-30 NOTE — Discharge Instructions (Signed)
Return to the hospital in the morning to obtain an outpatient ultrasound of your leg.  This ultrasound will check to make sure you do not have a "blood clot" in the veins in your leg.  You received an injection of a "blood thinning" medication during your Emergency Department visit today as prophylaxis ("precautionary treatment") for a "blood clot" in your leg.  You will receive the results of your testing shortly after having it completed.  If there is a blood clot in your leg, you will be re-directed to the Emergency Department for further evaluation.  If there is not a blood clot in your leg, your leg pain is likely due to muscle pain.  If that is the case, take over the counter tylenol and/or ibuprofen, as directed on packaging, as needed for discomfort.  Apply moist heat or ice, several times per day for 20 minutes each time.  Call your regular medical doctor tomorrow to schedule a follow up appointment within the next 3 days.  Return to the Emergency Department immediately if worsening.

## 2016-08-30 NOTE — ED Triage Notes (Signed)
Pt reports a bruise and a knot on left calf for about a week with no injury.  Sent by pcp to r/o dvt.

## 2016-08-31 ENCOUNTER — Ambulatory Visit (HOSPITAL_COMMUNITY)
Admission: RE | Admit: 2016-08-31 | Discharge: 2016-08-31 | Disposition: A | Discharge status: Home / Self Care | Payer: BLUE CROSS/BLUE SHIELD | Source: Ambulatory Visit | Attending: Emergency Medicine | Admitting: Emergency Medicine

## 2016-08-31 DIAGNOSIS — M79662 Pain in left lower leg: Secondary | ICD-10-CM | POA: Diagnosis not present

## 2016-08-31 DIAGNOSIS — S8012XA Contusion of left lower leg, initial encounter: Secondary | ICD-10-CM | POA: Insufficient documentation

## 2016-09-06 DIAGNOSIS — F331 Major depressive disorder, recurrent, moderate: Secondary | ICD-10-CM | POA: Diagnosis not present

## 2016-09-06 DIAGNOSIS — R5383 Other fatigue: Secondary | ICD-10-CM | POA: Diagnosis not present

## 2016-09-06 DIAGNOSIS — L700 Acne vulgaris: Secondary | ICD-10-CM | POA: Diagnosis not present

## 2016-09-06 DIAGNOSIS — R109 Unspecified abdominal pain: Secondary | ICD-10-CM | POA: Diagnosis not present

## 2016-09-06 DIAGNOSIS — Z6822 Body mass index (BMI) 22.0-22.9, adult: Secondary | ICD-10-CM | POA: Diagnosis not present

## 2016-09-06 DIAGNOSIS — Z1389 Encounter for screening for other disorder: Secondary | ICD-10-CM | POA: Diagnosis not present

## 2016-10-03 DIAGNOSIS — F4310 Post-traumatic stress disorder, unspecified: Secondary | ICD-10-CM | POA: Diagnosis not present

## 2016-10-03 DIAGNOSIS — F3181 Bipolar II disorder: Secondary | ICD-10-CM | POA: Diagnosis not present

## 2017-01-04 DIAGNOSIS — R635 Abnormal weight gain: Secondary | ICD-10-CM | POA: Diagnosis not present

## 2017-01-04 DIAGNOSIS — Z6824 Body mass index (BMI) 24.0-24.9, adult: Secondary | ICD-10-CM | POA: Diagnosis not present

## 2017-01-04 DIAGNOSIS — R140 Abdominal distension (gaseous): Secondary | ICD-10-CM | POA: Diagnosis not present

## 2017-01-22 DIAGNOSIS — F4310 Post-traumatic stress disorder, unspecified: Secondary | ICD-10-CM | POA: Diagnosis not present

## 2017-01-22 DIAGNOSIS — F3181 Bipolar II disorder: Secondary | ICD-10-CM | POA: Diagnosis not present

## 2017-03-06 DIAGNOSIS — N3001 Acute cystitis with hematuria: Secondary | ICD-10-CM | POA: Diagnosis not present

## 2017-03-06 DIAGNOSIS — Z6824 Body mass index (BMI) 24.0-24.9, adult: Secondary | ICD-10-CM | POA: Diagnosis not present

## 2017-04-02 DIAGNOSIS — Z6824 Body mass index (BMI) 24.0-24.9, adult: Secondary | ICD-10-CM | POA: Diagnosis not present

## 2017-04-02 DIAGNOSIS — K5900 Constipation, unspecified: Secondary | ICD-10-CM | POA: Diagnosis not present

## 2017-04-21 IMAGING — DX DG CHEST 2V
2 series · 2 of 2 positions shown · non-contrast
Comparison: Thoracic spine radiograph dated 08/21/2008

CLINICAL DATA: 29-year-old female with intermittent chest pain and
shortness of breath.

EXAM:
CHEST  2 VIEW

[chest pa]
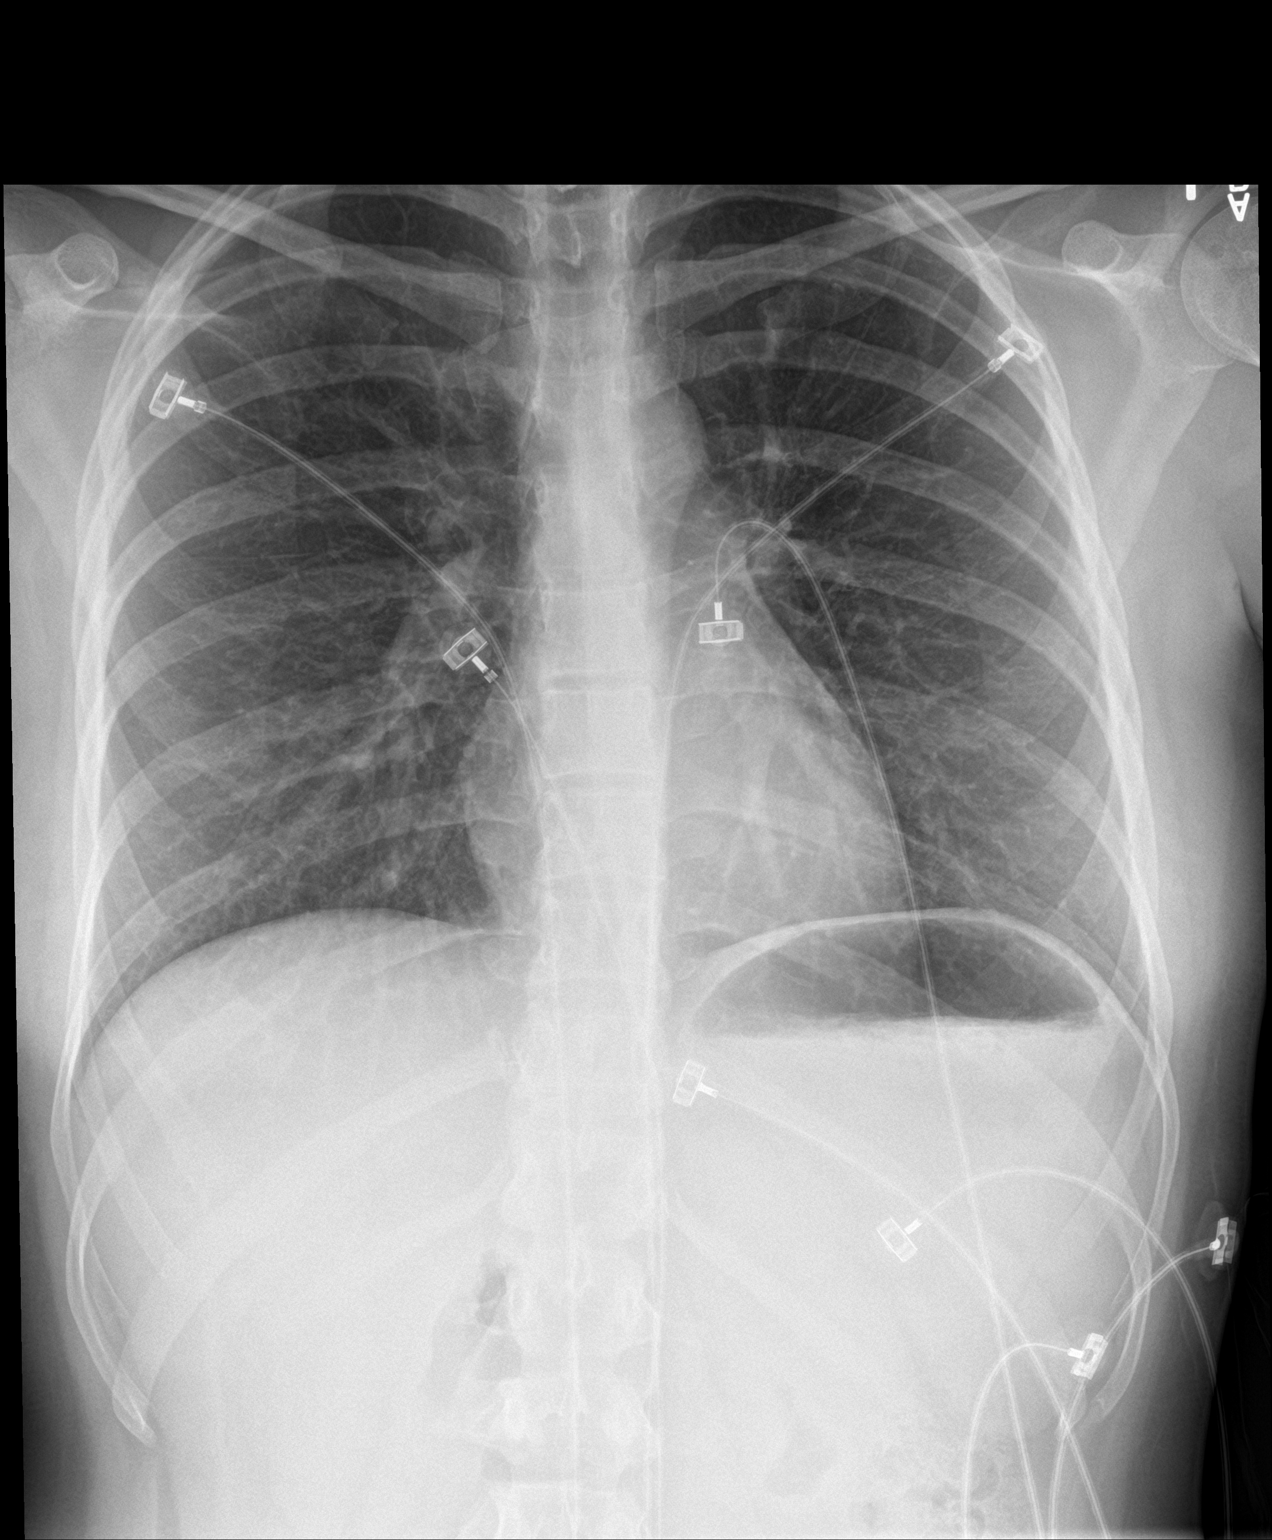

[chest lat]
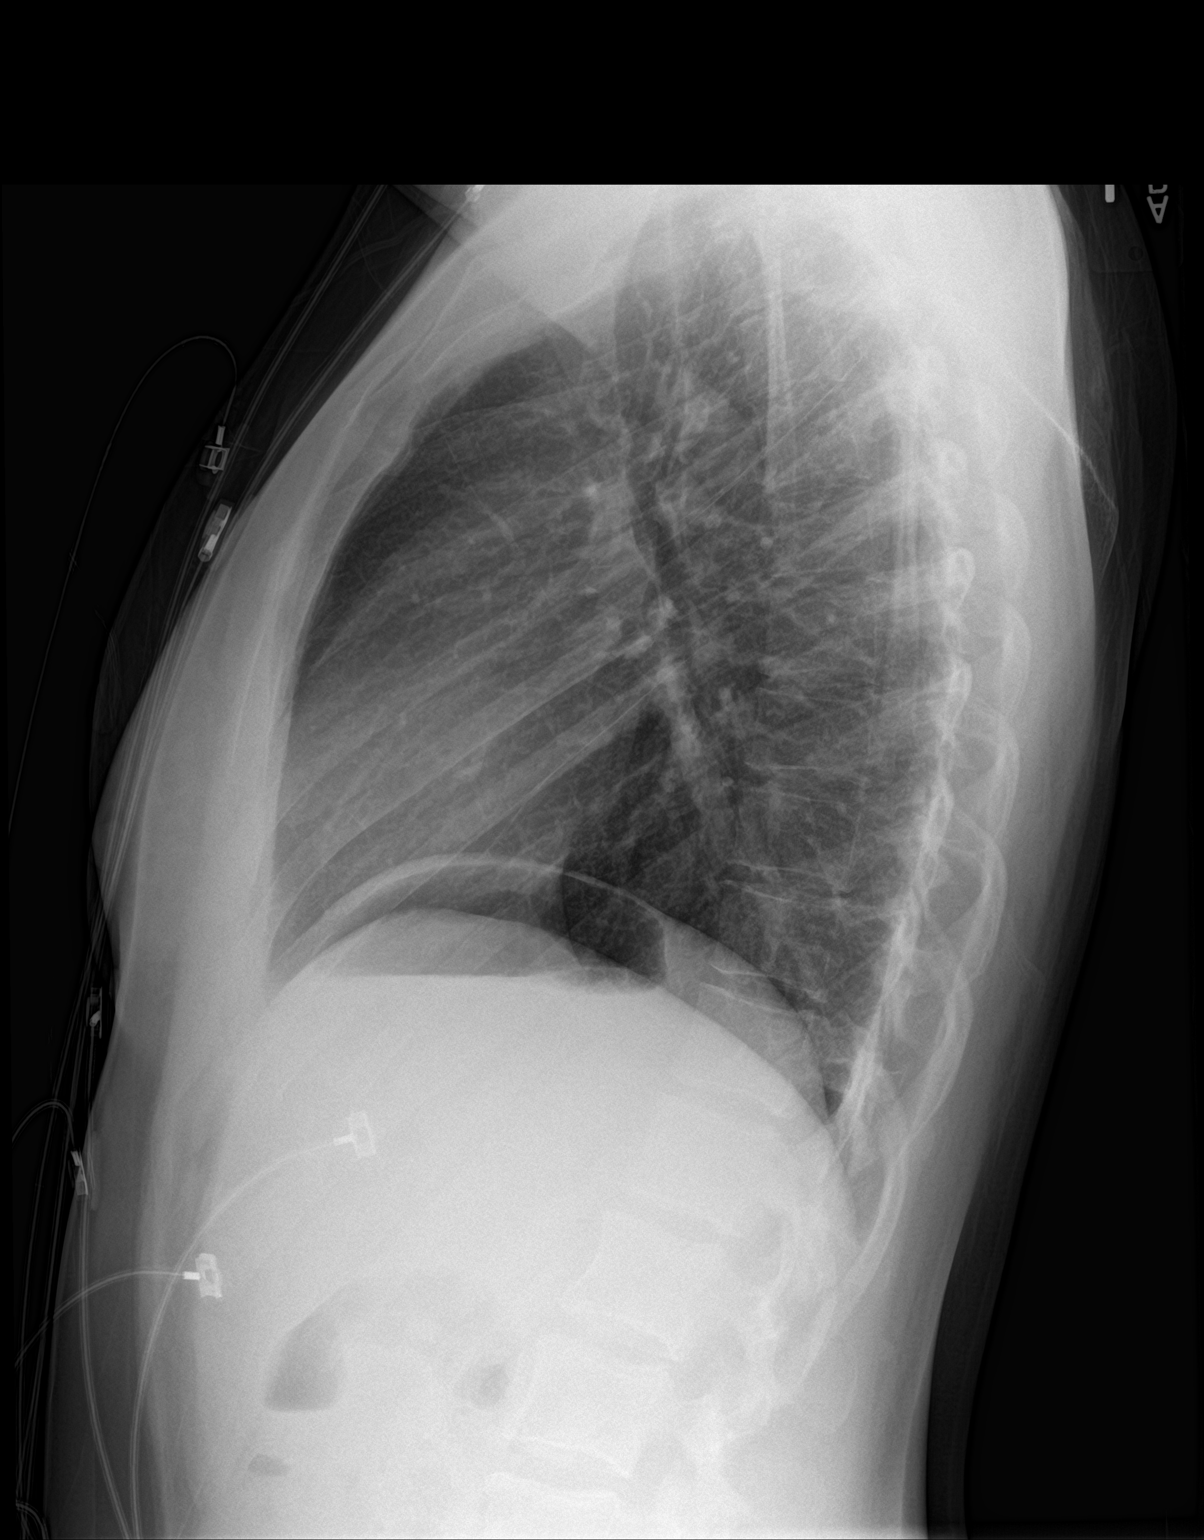

[2 of 2 positions shown; findings below may reference images not displayed]

FINDINGS: The heart size and mediastinal contours are within normal limits.
Both lungs are clear. The visualized skeletal structures are
unremarkable.
IMPRESSION: No active cardiopulmonary disease.

## 2017-06-10 DIAGNOSIS — L03115 Cellulitis of right lower limb: Secondary | ICD-10-CM | POA: Diagnosis not present

## 2017-06-10 DIAGNOSIS — Z6823 Body mass index (BMI) 23.0-23.9, adult: Secondary | ICD-10-CM | POA: Diagnosis not present

## 2017-07-16 DIAGNOSIS — F4310 Post-traumatic stress disorder, unspecified: Secondary | ICD-10-CM | POA: Diagnosis not present

## 2017-07-16 DIAGNOSIS — F3181 Bipolar II disorder: Secondary | ICD-10-CM | POA: Diagnosis not present

## 2017-11-19 DIAGNOSIS — F3181 Bipolar II disorder: Secondary | ICD-10-CM | POA: Diagnosis not present

## 2017-11-19 DIAGNOSIS — F4310 Post-traumatic stress disorder, unspecified: Secondary | ICD-10-CM | POA: Diagnosis not present

## 2017-12-24 IMAGING — US US ABDOMEN LIMITED
1 series · 14 of 25 positions shown · non-contrast
Comparison: None.

CLINICAL DATA: Acute right upper quadrant abdominal pain.

EXAM:
US ABDOMEN LIMITED - RIGHT UPPER QUADRANT

[Series 1: us abdomen limited · 0.16mm/px · 14 of 56 slices shown]
[im 1/56]
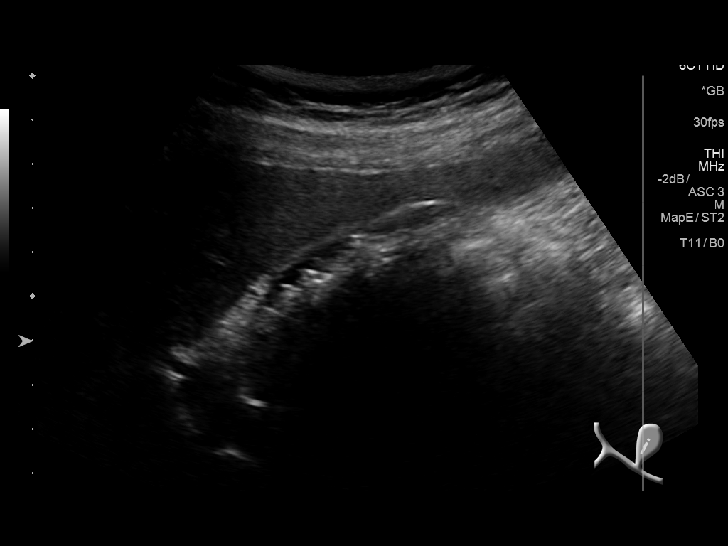
[im 5/56]
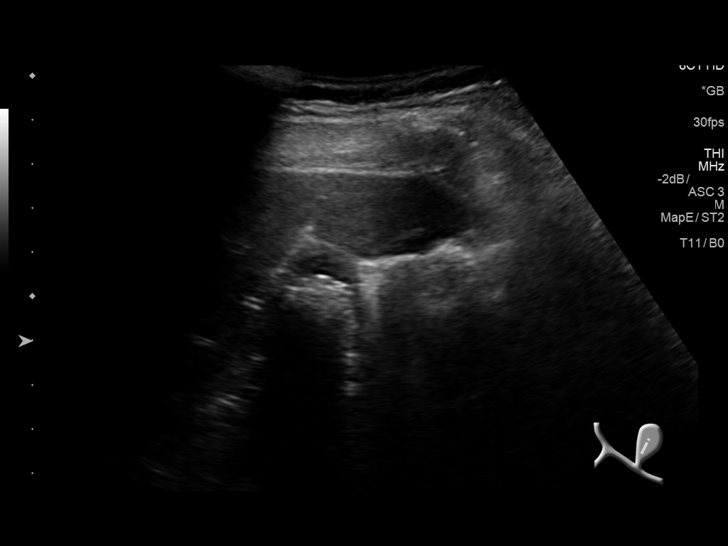
[im 10/56]
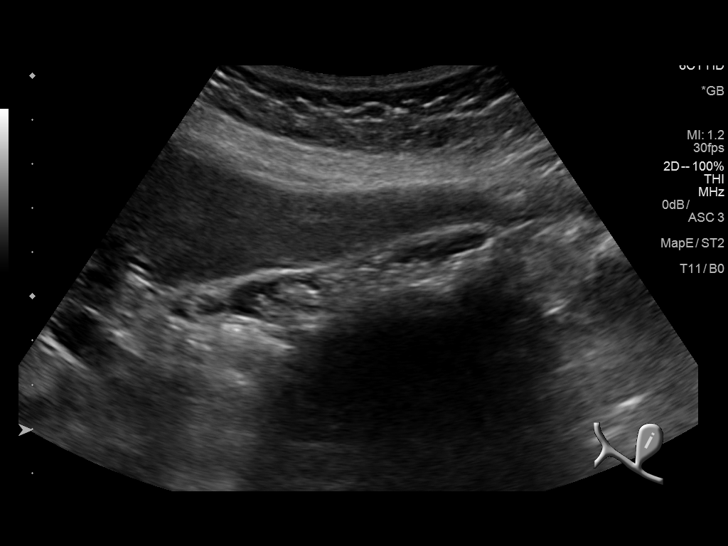
[im 14/56]
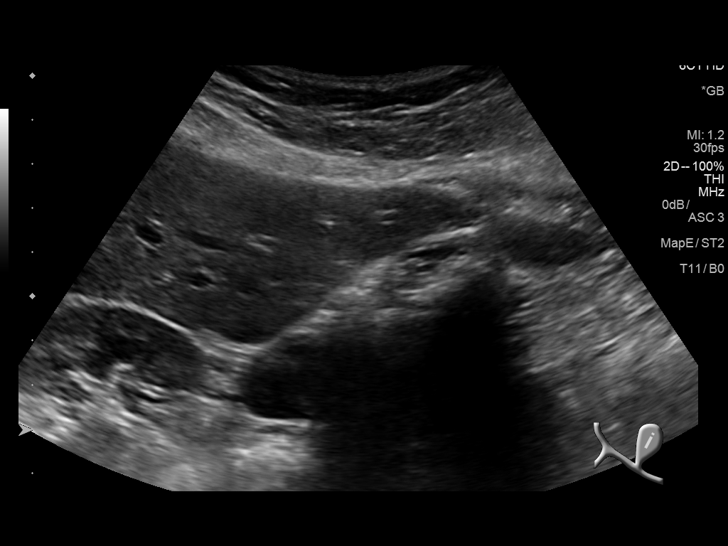
[im 19/56]
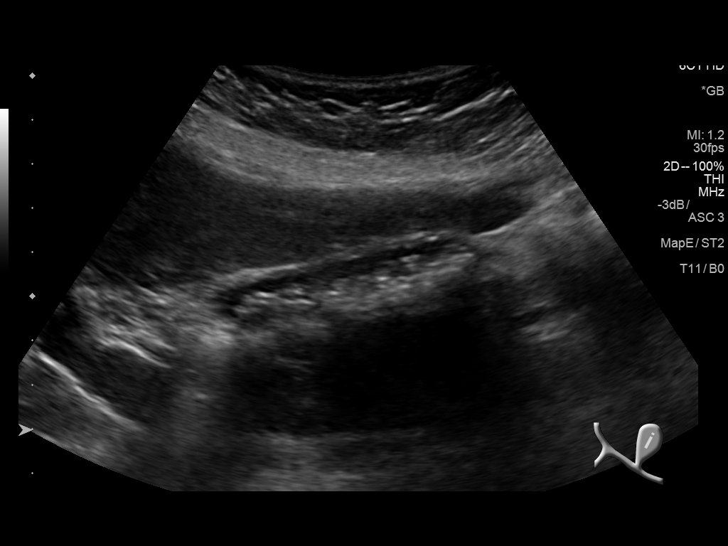
[im 21/56]
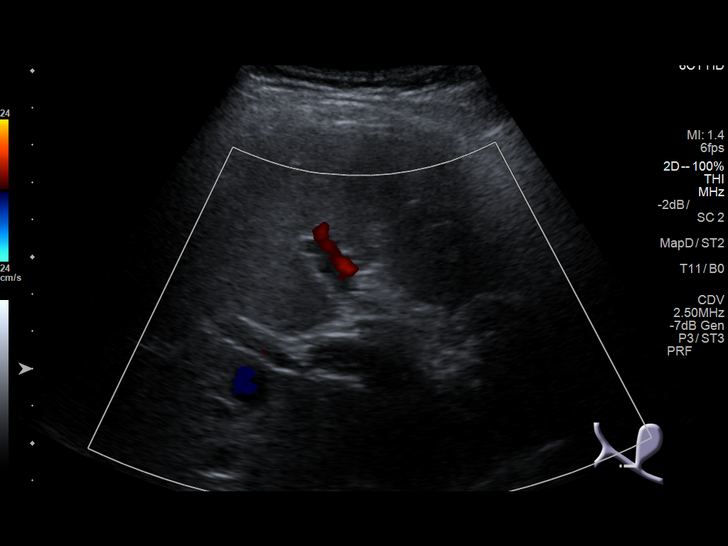
[im 26/56]
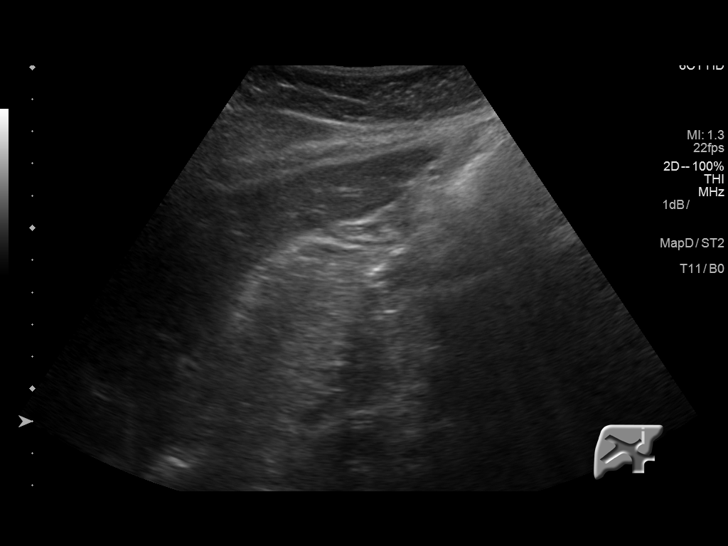
[im 30/56]
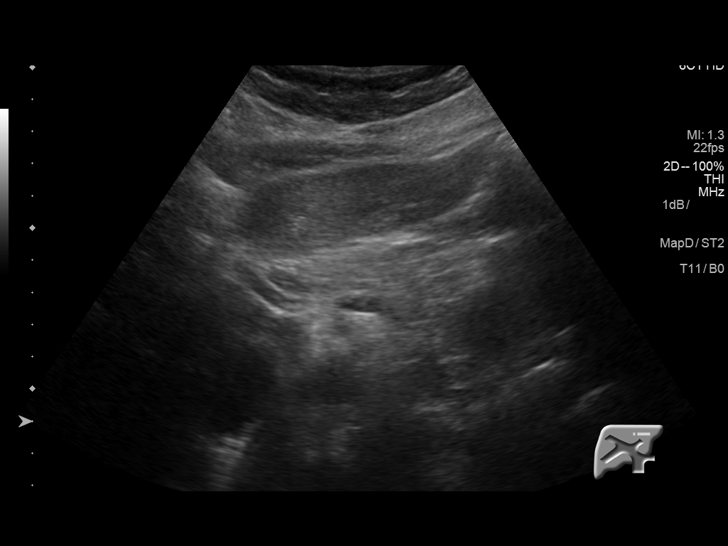
[im 35/56]
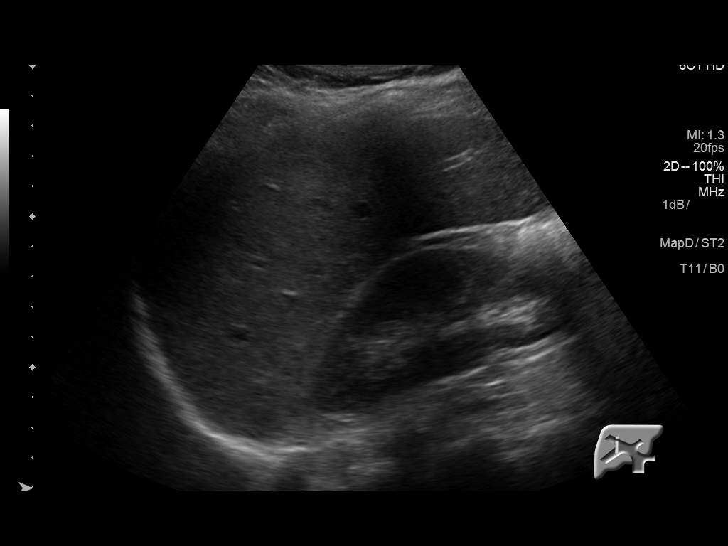
[im 37/56]
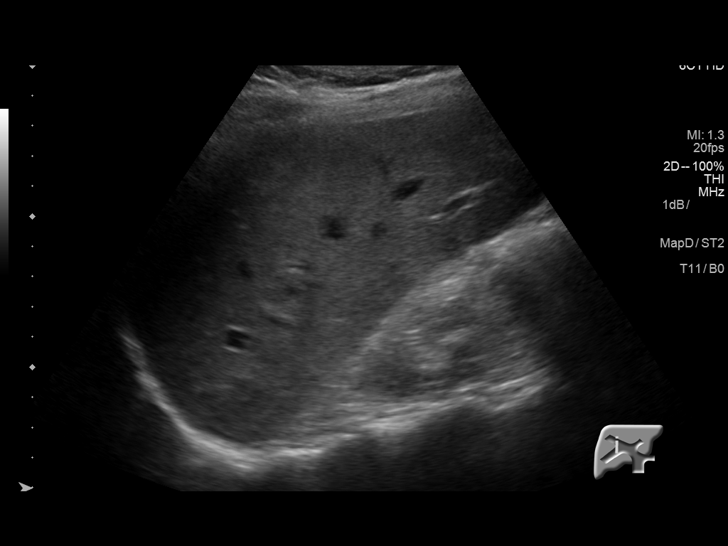
[im 42/56]
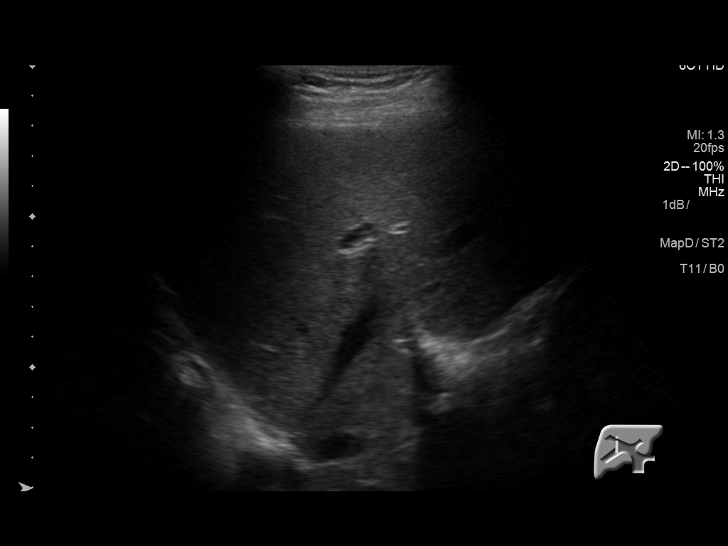
[im 46/56]
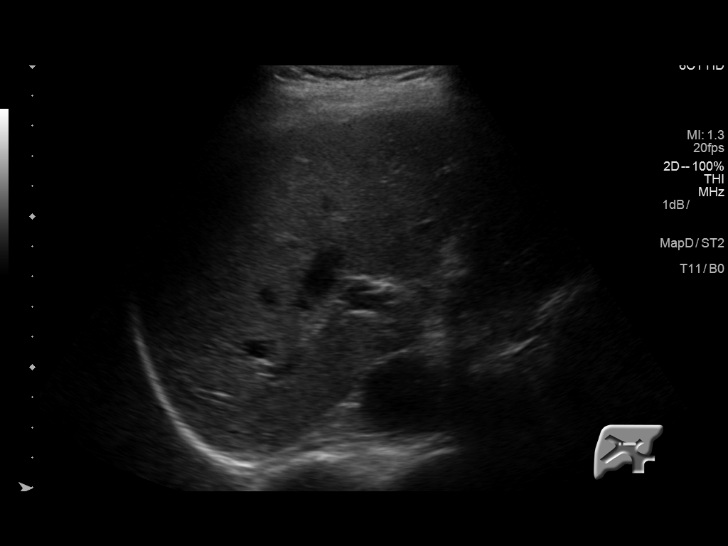
[im 51/56]
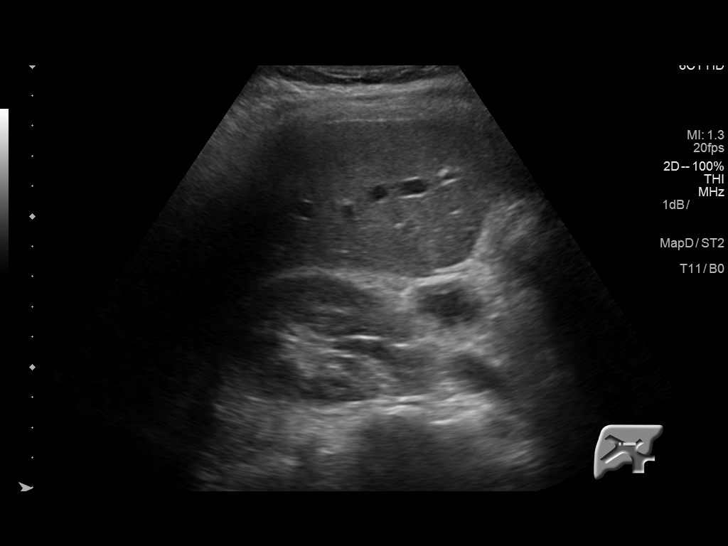
[im 56/56]
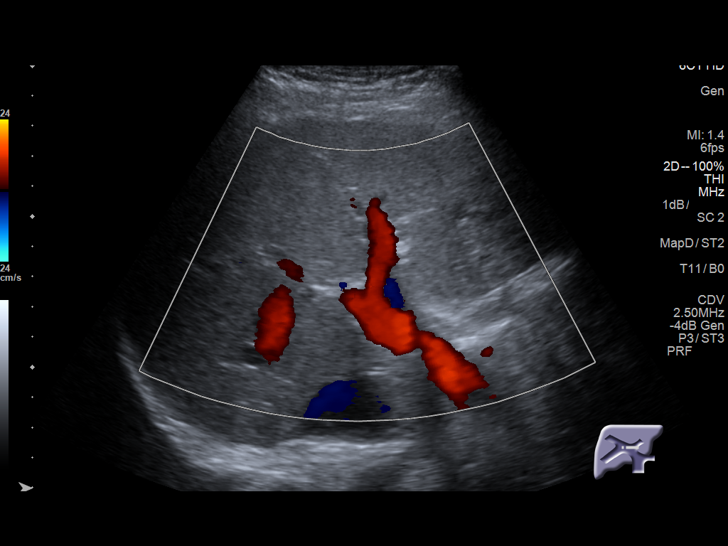

[14 of 25 positions shown; findings below may reference images not displayed]

FINDINGS: Gallbladder:

Multiple gallstones are noted. Minimal gallbladder wall thickening
is noted measured at 2.8 mm. No pericholecystic fluid or sonographic
Murphy's sign is noted.

Common bile duct:

Diameter: 3.1 mm which is within normal limits.

Liver:

No focal lesion identified. Within normal limits in parenchymal
echogenicity.
IMPRESSION: Cholelithiasis with minimal gallbladder wall thickening is noted,
but no pericholecystic fluid or sonographic Murphy's sign is noted.
If there is clinical concern for acute cholecystitis, HIDA scan is
recommended for further evaluation.

## 2018-04-29 DIAGNOSIS — F3181 Bipolar II disorder: Secondary | ICD-10-CM | POA: Diagnosis not present

## 2018-04-29 DIAGNOSIS — F4310 Post-traumatic stress disorder, unspecified: Secondary | ICD-10-CM | POA: Diagnosis not present

## 2018-08-28 DIAGNOSIS — R102 Pelvic and perineal pain: Secondary | ICD-10-CM | POA: Diagnosis not present

## 2018-08-28 DIAGNOSIS — N760 Acute vaginitis: Secondary | ICD-10-CM | POA: Diagnosis not present

## 2018-08-28 DIAGNOSIS — Z6824 Body mass index (BMI) 24.0-24.9, adult: Secondary | ICD-10-CM | POA: Diagnosis not present

## 2018-11-11 DIAGNOSIS — F3181 Bipolar II disorder: Secondary | ICD-10-CM | POA: Diagnosis not present

## 2018-11-11 DIAGNOSIS — F4310 Post-traumatic stress disorder, unspecified: Secondary | ICD-10-CM | POA: Diagnosis not present

## 2018-11-28 DIAGNOSIS — Z6825 Body mass index (BMI) 25.0-25.9, adult: Secondary | ICD-10-CM | POA: Diagnosis not present

## 2018-11-28 DIAGNOSIS — R300 Dysuria: Secondary | ICD-10-CM | POA: Diagnosis not present

## 2020-08-04 ENCOUNTER — Ambulatory Visit: Payer: Self-pay

## 2020-08-04 ENCOUNTER — Other Ambulatory Visit: Payer: Self-pay

## 2020-08-04 ENCOUNTER — Ambulatory Visit
Admission: RE | Admit: 2020-08-04 | Discharge: 2020-08-04 | Disposition: A | Discharge status: Home / Self Care | Payer: 59 | Source: Ambulatory Visit | Attending: Emergency Medicine | Admitting: Emergency Medicine

## 2020-08-04 VITALS — BP 128/87 | HR 98 | Temp 98.4°F | Resp 17

## 2020-08-04 DIAGNOSIS — R0789 Other chest pain: Secondary | ICD-10-CM | POA: Diagnosis not present

## 2020-08-04 DIAGNOSIS — R140 Abdominal distension (gaseous): Secondary | ICD-10-CM

## 2020-08-04 MED ORDER — ALBUTEROL SULFATE HFA 108 (90 BASE) MCG/ACT IN AERS
1.0000 | INHALATION_SPRAY | Freq: Four times a day (QID) | RESPIRATORY_TRACT | 0 refills | Status: AC | PRN
Start: 2020-08-04 — End: ?

## 2020-08-04 MED ORDER — PREDNISONE 20 MG PO TABS: 20.0000 mg | ORAL_TABLET | Freq: Two times a day (BID) | ORAL | 0 refills | Status: AC

## 2020-08-04 NOTE — ED Provider Notes (Signed)
[facility] CARE CENTER   [id number] 08/04/20 Arrival Time: 1748   CC: Chest tightness/ trouble breathing  SUBJECTIVE: History from: patient.  Brittany French is a 35 y.o. female who presents with chest tightness and trouble breathing and sleeping at night for the past month.  Was diagnosed with covid last month.  Denies alleviating factors.  Worse at night.  Denies previous symptoms in the past.   Denies fever, chills, fatigue, sinus pain, rhinorrhea, sore throat, SOB, wheezing, chest pain, nausea, changes in bowel or bladder habits.    Also mentions bloating x 1 year.  Initially thought it was a gluten allergy.  Has been eating home made food without relief.  Denies nausea, vomiting, and diarrhea.    ROS: As per HPI.  All other pertinent ROS negative.     Past Medical History:  Diagnosis Date   Anxiety    Depression    Seizures (HCC)    Last episode 2012   Past Surgical History:  Procedure Laterality Date   ABDOMINAL HYSTERECTOMY     ABDOMINAL SURGERY     CHOLECYSTECTOMY N/A 01/14/2015   Procedure: LAPAROSCOPIC CHOLECYSTECTOMY;  Surgeon: Franky Macho, MD;  Location: [facility];  Service: General;  Laterality: N/A;   No Known Allergies No current facility-administered medications on file prior to encounter.   Current Outpatient Medications on File Prior to Encounter  Medication Sig Dispense Refill   acetaminophen (TYLENOL) 325 MG tablet Take 650 mg by mouth every 6 (six) hours as needed.     norethindrone-ethinyl estradiol (NECON,BREVICON,MODICON) 0.5-35 MG-MCG tablet Take 1 tablet by mouth daily.     ondansetron (ZOFRAN) 4 MG tablet Take 1 tablet (4 mg total) by mouth every 8 (eight) hours as needed for nausea or vomiting. 20 tablet 1   oxyCODONE-acetaminophen (PERCOCET) 7.5-325 MG tablet Take 1-2 tablets by mouth every 4 (four) hours as needed. 50 tablet 0   sertraline (ZOLOFT) 100 MG tablet Take 100 mg by mouth at bedtime.     Social History   Socioeconomic History   Marital  status: Married    Spouse name: Not on file   Number of children: Not on file   Years of education: Not on file   Highest education level: Not on file  Occupational History   Not on file  Tobacco Use   Smoking status: Every Day    Packs/day: 0.50    Years: 9.00    Pack years: 4.50    Types: Cigarettes    Last attempt to quit: 01/13/2012    Years since quitting: 8.5   Smokeless tobacco: Not on file  Substance and Sexual Activity   Alcohol use: No   Drug use: No   Sexual activity: Yes  Other Topics Concern   Not on file  Social History Narrative   Not on file   Social Determinants of Health   Financial Resource Strain: Not on file  Food Insecurity: Not on file  Transportation Needs: Not on file  Physical Activity: Not on file  Stress: Not on file  Social Connections: Not on file  Intimate Partner Violence: Not on file   No family history on file.  OBJECTIVE:  Vitals:   08/04/20 1812  BP: 128/87  Pulse: 98  Resp: 17  Temp: 98.4 F (36.9 C)  TempSrc: Tympanic  SpO2: 98%    General appearance: alert; well-appearing, nontoxic; speaking in full sentences and tolerating own secretions HEENT: NCAT; Ears: EACs clear, TMs pearly gray; Eyes: PERRL.  EOM grossly intact.Nose:  nares patent without rhinorrhea, Throat: oropharynx clear, tonsils non erythematous or enlarged, uvula midline  Neck: supple without LAD Lungs: unlabored respirations, symmetrical air entry; cough: absent; no respiratory distress; CTAB Heart: regular rate and rhythm.  Skin: warm and dry Psychological: alert and cooperative; normal mood and affect   ASSESSMENT & PLAN:  1. Chest tightness   2. Bloating     Meds ordered this encounter  Medications   predniSONE (DELTASONE) 20 MG tablet    Sig: Take 1 tablet (20 mg total) by mouth 2 (two) times daily with a meal for 5 days.    Dispense:  10 tablet    Refill:  0    Order Specific Question:   Supervising Provider    Answer:   Mora Bellman   albuterol (VENTOLIN HFA) 108 (90 Base) MCG/ACT inhaler    Sig: Inhale 1-2 puffs into the lungs every 6 (six) hours as needed for wheezing or shortness of breath.    Dispense:  18 g    Refill:  0    Order Specific Question:   Supervising Provider    Answer:   Eustace Moore [[id number]]   Unable to rule out cardiac disease or blood clot in urgent care setting.  Offered patient further evaluation and management in the ED.  Patient declines at this time and would like to try outpatient therapy first.  Aware of the risk associated with this decision including missed diagnosis, organ damage, organ failure, and/or death.  Patient aware and in agreement.     Get plenty of rest and push fluids Prescribed prednisone and inhaler Follow up with PCP for further evaluation and management of possible long hauler covid symptom and chronic abdominal bloating Call or go to the ED if you have any new or worsening symptoms such as fever, worsening cough, shortness of breath, chest tightness, chest pain, turning blue, changes in mental status, etc...   Reviewed expectations re: course of current medical issues. Questions answered. Outlined signs and symptoms indicating need for more acute intervention. Patient verbalized understanding. After Visit Summary given.          Rennis Harding, PA-C 08/04/20 1828

## 2020-08-04 NOTE — Discharge Instructions (Addendum)
Unable to rule out cardiac disease or blood clot in urgent care setting.  Offered patient further evaluation and management in the ED.  Patient declines at this time and would like to try outpatient therapy first.  Aware of the risk associated with this decision including missed diagnosis, organ damage, organ failure, and/or death.  Patient aware and in agreement.     Get plenty of rest and push fluids Prescribed prednisone and inhaler Follow up with PCP for further evaluation and management of possible long hauler covid symptom and chronic abdominal bloating Call or go to the ED if you have any new or worsening symptoms such as fever, worsening cough, shortness of breath, chest tightness, chest pain, turning blue, changes in mental status, etc..Marland Kitchen

## 2020-09-15 ENCOUNTER — Other Ambulatory Visit: Payer: Self-pay

## 2020-09-15 ENCOUNTER — Ambulatory Visit
Admission: EM | Admit: 2020-09-15 | Discharge: 2020-09-15 | Disposition: A | Discharge status: Home / Self Care | Payer: 59 | Attending: Emergency Medicine | Admitting: Emergency Medicine

## 2020-09-15 ENCOUNTER — Encounter: Payer: Self-pay | Admitting: Emergency Medicine

## 2020-09-15 DIAGNOSIS — R0981 Nasal congestion: Secondary | ICD-10-CM

## 2020-09-15 DIAGNOSIS — J019 Acute sinusitis, unspecified: Secondary | ICD-10-CM

## 2020-09-15 MED ORDER — AMOXICILLIN-POT CLAVULANATE 875-125 MG PO TABS: 1.0000 | ORAL_TABLET | Freq: Two times a day (BID) | ORAL | 0 refills | Status: AC

## 2020-09-15 NOTE — ED Triage Notes (Signed)
Congestion and sore throat s/s started Saturday

## 2020-09-15 NOTE — ED Provider Notes (Signed)
[facility] CARE [facility]ENTER   [id number] 09/15/20 Arrival Time: 0102   CC: Sinus congestion  SUBJECTIVE: History from: Brittany French.  Brittany French is a 35 y.o. female who presents with congestion and sore throat x 5 days.  Admits to sick exposure.  Denies alleviating or aggravating factors.  Reports previous symptoms in the past.   Denies fever, chills, SOB, wheezing, chest pain, nausea, changes in bowel or bladder habits.     ROS: As per HPI.  All other pertinent ROS negative.     Past Medical History:  Diagnosis Date   Anxiety    Depression    Seizures (HCC)    Last episode 2012   Past Surgical History:  Procedure Laterality Date   ABDOMINAL HYSTERECTOMY     ABDOMINAL SURGERY     CHOLECYSTECTOMY N/A 01/14/2015   Procedure: LAPAROSCOPIC CHOLECYSTECTOMY;  Surgeon: Franky Macho, MD;  Location: [facility];  Service: General;  Laterality: N/A;   No Known Allergies No current facility-administered medications on file prior to encounter.   Current Outpatient Medications on File Prior to Encounter  Medication Sig Dispense Refill   acetaminophen (TYLENOL) 325 MG tablet Take 650 mg by mouth every 6 (six) hours as needed.     albuterol (VENTOLIN HFA) 108 (90 Base) MCG/ACT inhaler Inhale 1-2 puffs into the lungs every 6 (six) hours as needed for wheezing or shortness of breath. 18 g 0   norethindrone-ethinyl estradiol (NECON,BREVICON,MODICON) 0.5-35 MG-MCG tablet Take 1 tablet by mouth daily.     ondansetron (ZOFRAN) 4 MG tablet Take 1 tablet (4 mg total) by mouth every 8 (eight) hours as needed for nausea or vomiting. 20 tablet 1   oxyCODONE-acetaminophen (PERCOCET) 7.5-325 MG tablet Take 1-2 tablets by mouth every 4 (four) hours as needed. 50 tablet 0   sertraline (ZOLOFT) 100 MG tablet Take 100 mg by mouth at bedtime.     Social History   Socioeconomic History   Marital status: Married    Spouse name: Not on file   Number of children: Not on file   Years of education: Not on file    Highest education level: Not on file  Occupational History   Not on file  Tobacco Use   Smoking status: Every Day    Packs/day: 0.50    Years: 9.00    Pack years: 4.50    Types: Cigarettes    Last attempt to quit: 01/13/2012    Years since quitting: 8.6   Smokeless tobacco: Never  Substance and Sexual Activity   Alcohol use: No   Drug use: No   Sexual activity: Yes  Other Topics Concern   Not on file  Social History Narrative   Not on file   Social Determinants of Health   Financial Resource Strain: Not on file  Food Insecurity: Not on file  Transportation Needs: Not on file  Physical Activity: Not on file  Stress: Not on file  Social Connections: Not on file  Intimate Partner Violence: Not on file   No family history on file.  OBJECTIVE:  Vitals:   09/15/20 0824  BP: 110/77  Pulse: 88  Resp: 18  Temp: 98.2 F (36.8 C)  TempSrc: Temporal  SpO2: 96%     General appearance: alert; appears fatigued, but nontoxic; speaking in full sentences and tolerating own secretions HEENT: NCAT; Ears: EACs clear, TMs pearly gray; Eyes: PERRL.  EOM grossly intact. Sinuses: nontender; Nose: nares patent without rhinorrhea, Throat: oropharynx clear, tonsils non erythematous or enlarged, uvula midline  Neck: supple without LAD Lungs: unlabored respirations, symmetrical air entry; cough: absent; no respiratory distress; CTAB Heart: regular rate and rhythm.  Skin: warm and dry Psychological: alert and cooperative; normal mood and affect  ASSESSMENT & PLAN:  1. Sinus congestion   2. Acute non-recurrent sinusitis, unspecified location     Meds ordered this encounter  Medications   amoxicillin-clavulanate (AUGMENTIN) 875-125 MG tablet    Sig: Take 1 tablet by mouth every 12 (twelve) hours for 10 days.    Dispense:  20 tablet    Refill:  0    Order Specific Question:   Supervising Provider    Answer:   Eustace Moore [[id number]]    Get plenty of rest and push  fluids Augmentin for sinus infection Use OTC zyrtec for nasal congestion, runny nose, and/or sore throat Use OTC flonase for nasal congestion and runny nose Use medications daily for symptom relief Use OTC medications like ibuprofen or tylenol as needed fever or pain Call or go to the ED if you have any new or worsening symptoms such as fever, cough, shortness of breath, chest tightness, chest pain, turning blue, changes in mental status, etc...   Reviewed expectations re: course of current medical issues. Questions answered. Outlined signs and symptoms indicating need for more acute intervention. Brittany French verbalized understanding. After Visit Summary given.          Rennis Harding, PA-C 09/15/20 [phone]

## 2020-09-15 NOTE — Discharge Instructions (Signed)
Get plenty of rest and push fluids Augmentin for sinus infection Use OTC zyrtec for nasal congestion, runny nose, and/or sore throat Use OTC flonase for nasal congestion and runny nose Use medications daily for symptom relief Use OTC medications like ibuprofen or tylenol as needed fever or pain Call or go to the ED if you have any new or worsening symptoms such as fever, cough, shortness of breath, chest tightness, chest pain, turning blue, changes in mental status, etc...
